# Patient Record
Sex: Female | Born: 1960 | Race: White | Hispanic: No | Marital: Married | Smoking: Former smoker
Health system: Southern US, Community
[De-identification: ages and names within clinical notes are randomized; demographics above are authoritative.]

## PROBLEM LIST (undated history)

## (undated) DIAGNOSIS — G43909 Migraine, unspecified, not intractable, without status migrainosus: Secondary | ICD-10-CM

## (undated) DIAGNOSIS — G5600 Carpal tunnel syndrome, unspecified upper limb: Secondary | ICD-10-CM

## (undated) DIAGNOSIS — H9072 Mixed conductive and sensorineural hearing loss, unilateral, left ear, with unrestricted hearing on the contralateral side: Secondary | ICD-10-CM

## (undated) DIAGNOSIS — G2581 Restless legs syndrome: Secondary | ICD-10-CM

## (undated) DIAGNOSIS — H8000 Otosclerosis involving oval window, nonobliterative, unspecified ear: Secondary | ICD-10-CM

## (undated) DIAGNOSIS — M1990 Unspecified osteoarthritis, unspecified site: Secondary | ICD-10-CM

## (undated) DIAGNOSIS — K449 Diaphragmatic hernia without obstruction or gangrene: Secondary | ICD-10-CM

## (undated) DIAGNOSIS — K219 Gastro-esophageal reflux disease without esophagitis: Secondary | ICD-10-CM

## (undated) HISTORY — DX: Carpal tunnel syndrome, unspecified upper limb: G56.00

## (undated) HISTORY — DX: Migraine, unspecified, not intractable, without status migrainosus: G43.909

## (undated) HISTORY — PX: UPPER GASTROINTESTINAL ENDOSCOPY: SHX188

## (undated) HISTORY — PX: COLONOSCOPY: SHX174

## (undated) HISTORY — DX: Unspecified osteoarthritis, unspecified site: M19.90

## (undated) HISTORY — DX: Otosclerosis involving oval window, nonobliterative, unspecified ear: H80.00

## (undated) HISTORY — DX: Diaphragmatic hernia without obstruction or gangrene: K44.9

## (undated) HISTORY — DX: Gastro-esophageal reflux disease without esophagitis: K21.9

## (undated) HISTORY — DX: Restless legs syndrome: G25.81

## (undated) HISTORY — DX: Mixed conductive and sensorineural hearing loss, unilateral, left ear, with unrestricted hearing on the contralateral side: H90.72

---

## 2000-10-14 ENCOUNTER — Other Ambulatory Visit: Admission: RE | Admit: 2000-10-14 | Discharge: 2000-10-14 | Payer: Self-pay | Admitting: Obstetrics and Gynecology

## 2000-11-04 ENCOUNTER — Ambulatory Visit (HOSPITAL_COMMUNITY): Admission: RE | Admit: 2000-11-04 | Discharge: 2000-11-04 | Payer: Self-pay | Admitting: Obstetrics and Gynecology

## 2000-11-04 ENCOUNTER — Encounter: Payer: Self-pay | Admitting: Obstetrics and Gynecology

## 2001-10-26 ENCOUNTER — Other Ambulatory Visit: Admission: RE | Admit: 2001-10-26 | Discharge: 2001-10-26 | Payer: Self-pay | Admitting: Obstetrics and Gynecology

## 2001-11-29 ENCOUNTER — Ambulatory Visit (HOSPITAL_COMMUNITY): Admission: RE | Admit: 2001-11-29 | Discharge: 2001-11-29 | Payer: Self-pay | Admitting: Obstetrics and Gynecology

## 2001-11-29 ENCOUNTER — Encounter: Payer: Self-pay | Admitting: Obstetrics and Gynecology

## 2002-09-14 ENCOUNTER — Ambulatory Visit (HOSPITAL_BASED_OUTPATIENT_CLINIC_OR_DEPARTMENT_OTHER): Admission: RE | Admit: 2002-09-14 | Discharge: 2002-09-14 | Payer: Self-pay | Admitting: Otolaryngology

## 2002-10-08 ENCOUNTER — Encounter: Admission: RE | Admit: 2002-10-08 | Discharge: 2002-10-08 | Payer: Self-pay | Admitting: Obstetrics and Gynecology

## 2002-10-08 ENCOUNTER — Encounter: Payer: Self-pay | Admitting: Obstetrics and Gynecology

## 2002-11-05 ENCOUNTER — Other Ambulatory Visit: Admission: RE | Admit: 2002-11-05 | Discharge: 2002-11-05 | Payer: Self-pay | Admitting: Obstetrics and Gynecology

## 2003-11-07 ENCOUNTER — Ambulatory Visit (HOSPITAL_COMMUNITY): Admission: RE | Admit: 2003-11-07 | Discharge: 2003-11-07 | Payer: Self-pay | Admitting: Obstetrics and Gynecology

## 2003-12-17 ENCOUNTER — Other Ambulatory Visit: Admission: RE | Admit: 2003-12-17 | Discharge: 2003-12-17 | Payer: Self-pay | Admitting: Obstetrics and Gynecology

## 2004-01-14 ENCOUNTER — Other Ambulatory Visit: Admission: RE | Admit: 2004-01-14 | Discharge: 2004-01-14 | Payer: Self-pay | Admitting: *Deleted

## 2004-12-02 ENCOUNTER — Ambulatory Visit (HOSPITAL_COMMUNITY): Admission: RE | Admit: 2004-12-02 | Discharge: 2004-12-02 | Payer: Self-pay | Admitting: Obstetrics and Gynecology

## 2005-02-05 ENCOUNTER — Ambulatory Visit: Payer: Self-pay | Admitting: Family Medicine

## 2005-03-09 ENCOUNTER — Ambulatory Visit: Payer: Self-pay | Admitting: Family Medicine

## 2005-09-22 ENCOUNTER — Emergency Department (HOSPITAL_COMMUNITY): Admission: EM | Admit: 2005-09-22 | Discharge: 2005-09-22 | Payer: Self-pay | Admitting: Emergency Medicine

## 2005-09-23 ENCOUNTER — Ambulatory Visit: Payer: Self-pay | Admitting: Family Medicine

## 2005-10-13 ENCOUNTER — Ambulatory Visit: Payer: Self-pay | Admitting: Family Medicine

## 2005-12-06 ENCOUNTER — Ambulatory Visit (HOSPITAL_COMMUNITY): Admission: RE | Admit: 2005-12-06 | Discharge: 2005-12-06 | Payer: Self-pay | Admitting: Obstetrics and Gynecology

## 2005-12-15 ENCOUNTER — Ambulatory Visit: Payer: Self-pay | Admitting: Internal Medicine

## 2006-03-23 ENCOUNTER — Encounter: Admission: RE | Admit: 2006-03-23 | Discharge: 2006-03-23 | Payer: Self-pay | Admitting: Obstetrics and Gynecology

## 2006-11-08 HISTORY — PX: STAPEDECTOMY: SHX2435

## 2007-02-08 ENCOUNTER — Ambulatory Visit: Payer: Self-pay | Admitting: Family Medicine

## 2007-02-08 ENCOUNTER — Encounter: Payer: Self-pay | Admitting: Family Medicine

## 2007-02-08 DIAGNOSIS — J019 Acute sinusitis, unspecified: Secondary | ICD-10-CM | POA: Insufficient documentation

## 2007-03-30 ENCOUNTER — Ambulatory Visit (HOSPITAL_COMMUNITY): Admission: RE | Admit: 2007-03-30 | Discharge: 2007-03-30 | Payer: Self-pay | Admitting: Obstetrics and Gynecology

## 2007-05-01 ENCOUNTER — Ambulatory Visit: Payer: Self-pay | Admitting: Family Medicine

## 2007-05-23 ENCOUNTER — Ambulatory Visit: Payer: Self-pay | Admitting: Internal Medicine

## 2007-05-23 DIAGNOSIS — N63 Unspecified lump in unspecified breast: Secondary | ICD-10-CM

## 2007-05-23 DIAGNOSIS — M542 Cervicalgia: Secondary | ICD-10-CM | POA: Insufficient documentation

## 2007-05-31 ENCOUNTER — Ambulatory Visit: Payer: Self-pay | Admitting: Internal Medicine

## 2007-05-31 DIAGNOSIS — K219 Gastro-esophageal reflux disease without esophagitis: Secondary | ICD-10-CM

## 2007-06-01 ENCOUNTER — Encounter: Admission: RE | Admit: 2007-06-01 | Discharge: 2007-06-01 | Payer: Self-pay | Admitting: Internal Medicine

## 2007-06-02 ENCOUNTER — Ambulatory Visit: Payer: Self-pay | Admitting: Internal Medicine

## 2007-06-05 ENCOUNTER — Ambulatory Visit: Payer: Self-pay | Admitting: Internal Medicine

## 2007-06-07 LAB — CONVERTED CEMR LAB
Amylase: 111 units/L (ref 27–131)
Basophils Absolute: 0 10*3/uL (ref 0.0–0.1)
Basophils Relative: 0.8 % (ref 0.0–1.0)
CK-MB: 1.1 ng/mL (ref 0.3–4.0)
Eosinophils Absolute: 0.1 10*3/uL (ref 0.0–0.6)
Eosinophils Relative: 2.6 % (ref 0.0–5.0)
HCT: 37.4 % (ref 36.0–46.0)
Hemoglobin: 13.1 g/dL (ref 12.0–15.0)
Lipase: 29 units/L (ref 11.0–59.0)
Lymphocytes Relative: 33.7 % (ref 12.0–46.0)
MCHC: 34.9 g/dL (ref 30.0–36.0)
MCV: 91.8 fL (ref 78.0–100.0)
Monocytes Absolute: 0.6 10*3/uL (ref 0.2–0.7)
Monocytes Relative: 11.3 % — ABNORMAL HIGH (ref 3.0–11.0)
Neutro Abs: 2.9 10*3/uL (ref 1.4–7.7)
Neutrophils Relative %: 51.6 % (ref 43.0–77.0)
Platelets: 262 10*3/uL (ref 150–400)
RBC: 4.07 M/uL (ref 3.87–5.11)
RDW: 12.2 % (ref 11.5–14.6)
Total CK: 58 units/L (ref 7–177)
WBC: 5.5 10*3/uL (ref 4.5–10.5)

## 2007-07-12 ENCOUNTER — Telehealth (INDEPENDENT_AMBULATORY_CARE_PROVIDER_SITE_OTHER): Payer: Self-pay | Admitting: *Deleted

## 2007-09-07 ENCOUNTER — Telehealth (INDEPENDENT_AMBULATORY_CARE_PROVIDER_SITE_OTHER): Payer: Self-pay | Admitting: *Deleted

## 2007-09-11 ENCOUNTER — Telehealth (INDEPENDENT_AMBULATORY_CARE_PROVIDER_SITE_OTHER): Payer: Self-pay | Admitting: *Deleted

## 2007-09-13 ENCOUNTER — Telehealth (INDEPENDENT_AMBULATORY_CARE_PROVIDER_SITE_OTHER): Payer: Self-pay | Admitting: *Deleted

## 2007-09-13 ENCOUNTER — Ambulatory Visit: Payer: Self-pay | Admitting: Internal Medicine

## 2007-09-14 ENCOUNTER — Ambulatory Visit: Payer: Self-pay | Admitting: Internal Medicine

## 2007-09-14 LAB — CONVERTED CEMR LAB
Basophils Absolute: 0 10*3/uL (ref 0.0–0.1)
Eosinophils Absolute: 0.2 10*3/uL (ref 0.0–0.6)
Eosinophils Relative: 4.9 % (ref 0.0–5.0)
GFR calc Af Amer: 100 mL/min
GFR calc non Af Amer: 82 mL/min
Glucose, Bld: 91 mg/dL (ref 70–99)
HCT: 38.9 % (ref 36.0–46.0)
Lymphocytes Relative: 36.5 % (ref 12.0–46.0)
MCHC: 34.5 g/dL (ref 30.0–36.0)
MCV: 92.4 fL (ref 78.0–100.0)
Monocytes Absolute: 0.4 10*3/uL (ref 0.2–0.7)
Neutro Abs: 2.4 10*3/uL (ref 1.4–7.7)
Neutrophils Relative %: 50.2 % (ref 43.0–77.0)
Potassium: 3.8 meq/L (ref 3.5–5.1)
RBC: 4.21 M/uL (ref 3.87–5.11)
Sodium: 140 meq/L (ref 135–145)

## 2007-09-25 ENCOUNTER — Telehealth (INDEPENDENT_AMBULATORY_CARE_PROVIDER_SITE_OTHER): Payer: Self-pay | Admitting: *Deleted

## 2007-10-03 ENCOUNTER — Telehealth: Payer: Self-pay | Admitting: Internal Medicine

## 2007-10-12 ENCOUNTER — Ambulatory Visit: Payer: Self-pay | Admitting: Gastroenterology

## 2007-10-13 ENCOUNTER — Encounter: Payer: Self-pay | Admitting: Internal Medicine

## 2007-10-13 ENCOUNTER — Ambulatory Visit: Payer: Self-pay | Admitting: Gastroenterology

## 2007-10-13 ENCOUNTER — Encounter: Payer: Self-pay | Admitting: Gastroenterology

## 2007-10-13 ENCOUNTER — Ambulatory Visit (HOSPITAL_COMMUNITY): Admission: RE | Admit: 2007-10-13 | Discharge: 2007-10-13 | Payer: Self-pay | Admitting: Gastroenterology

## 2007-10-16 ENCOUNTER — Encounter: Payer: Self-pay | Admitting: Gastroenterology

## 2007-10-26 ENCOUNTER — Ambulatory Visit: Payer: Self-pay | Admitting: Gastroenterology

## 2007-11-06 ENCOUNTER — Ambulatory Visit (HOSPITAL_COMMUNITY): Admission: RE | Admit: 2007-11-06 | Discharge: 2007-11-06 | Payer: Self-pay | Admitting: Gastroenterology

## 2007-11-20 ENCOUNTER — Encounter: Admission: RE | Admit: 2007-11-20 | Discharge: 2007-11-20 | Payer: Self-pay | Admitting: Obstetrics and Gynecology

## 2007-11-25 DIAGNOSIS — G43909 Migraine, unspecified, not intractable, without status migrainosus: Secondary | ICD-10-CM | POA: Insufficient documentation

## 2007-11-25 DIAGNOSIS — G2581 Restless legs syndrome: Secondary | ICD-10-CM

## 2007-12-01 ENCOUNTER — Ambulatory Visit: Payer: Self-pay | Admitting: Gastroenterology

## 2007-12-12 ENCOUNTER — Ambulatory Visit: Payer: Self-pay | Admitting: Cardiovascular Disease

## 2007-12-28 ENCOUNTER — Ambulatory Visit: Payer: Self-pay

## 2007-12-28 ENCOUNTER — Encounter: Payer: Self-pay | Admitting: Cardiovascular Disease

## 2008-01-15 ENCOUNTER — Ambulatory Visit: Payer: Self-pay | Admitting: Internal Medicine

## 2008-01-16 ENCOUNTER — Telehealth (INDEPENDENT_AMBULATORY_CARE_PROVIDER_SITE_OTHER): Payer: Self-pay | Admitting: *Deleted

## 2008-01-16 ENCOUNTER — Encounter: Payer: Self-pay | Admitting: Internal Medicine

## 2008-06-03 ENCOUNTER — Encounter: Admission: RE | Admit: 2008-06-03 | Discharge: 2008-06-03 | Payer: Self-pay | Admitting: Obstetrics and Gynecology

## 2008-09-13 ENCOUNTER — Ambulatory Visit: Payer: Self-pay | Admitting: Internal Medicine

## 2008-11-18 ENCOUNTER — Encounter: Admission: RE | Admit: 2008-11-18 | Discharge: 2008-11-18 | Payer: Self-pay | Admitting: Obstetrics and Gynecology

## 2009-01-13 ENCOUNTER — Ambulatory Visit: Payer: Self-pay | Admitting: Internal Medicine

## 2009-06-16 ENCOUNTER — Encounter: Admission: RE | Admit: 2009-06-16 | Discharge: 2009-06-16 | Payer: Self-pay | Admitting: Obstetrics and Gynecology

## 2009-09-23 ENCOUNTER — Telehealth (INDEPENDENT_AMBULATORY_CARE_PROVIDER_SITE_OTHER): Payer: Self-pay | Admitting: *Deleted

## 2009-09-25 ENCOUNTER — Ambulatory Visit: Payer: Self-pay | Admitting: Gastroenterology

## 2009-09-25 DIAGNOSIS — R1013 Epigastric pain: Secondary | ICD-10-CM | POA: Insufficient documentation

## 2009-09-25 DIAGNOSIS — K449 Diaphragmatic hernia without obstruction or gangrene: Secondary | ICD-10-CM | POA: Insufficient documentation

## 2009-09-26 ENCOUNTER — Encounter: Admission: RE | Admit: 2009-09-26 | Discharge: 2009-09-26 | Payer: Self-pay | Admitting: Obstetrics and Gynecology

## 2009-10-13 ENCOUNTER — Emergency Department (HOSPITAL_COMMUNITY): Admission: EM | Admit: 2009-10-13 | Discharge: 2009-10-13 | Payer: Self-pay | Admitting: Emergency Medicine

## 2009-11-04 ENCOUNTER — Telehealth (INDEPENDENT_AMBULATORY_CARE_PROVIDER_SITE_OTHER): Payer: Self-pay | Admitting: *Deleted

## 2009-11-04 ENCOUNTER — Ambulatory Visit: Payer: Self-pay | Admitting: Internal Medicine

## 2010-05-20 ENCOUNTER — Telehealth: Payer: Self-pay | Admitting: Internal Medicine

## 2010-05-21 ENCOUNTER — Ambulatory Visit: Payer: Self-pay | Admitting: Internal Medicine

## 2010-05-21 DIAGNOSIS — M25519 Pain in unspecified shoulder: Secondary | ICD-10-CM | POA: Insufficient documentation

## 2010-06-08 ENCOUNTER — Encounter: Payer: Self-pay | Admitting: Internal Medicine

## 2010-11-12 ENCOUNTER — Encounter
Admission: RE | Admit: 2010-11-12 | Discharge: 2010-11-12 | Payer: Self-pay | Source: Home / Self Care | Attending: Obstetrics and Gynecology | Admitting: Obstetrics and Gynecology

## 2010-11-26 ENCOUNTER — Ambulatory Visit
Admission: RE | Admit: 2010-11-26 | Discharge: 2010-11-26 | Payer: Self-pay | Source: Home / Self Care | Attending: Family Medicine | Admitting: Family Medicine

## 2010-11-28 ENCOUNTER — Encounter: Payer: Self-pay | Admitting: Obstetrics and Gynecology

## 2010-12-08 NOTE — Consult Note (Signed)
Summary: local injection to the left shoulder--orthopedic surgery  [facility]   Imported By: Lanelle Bal 06/18/2010 10:55:42  _____________________________________________________________________  External Attachment:    Type:   Image     Comment:   External Document

## 2010-12-08 NOTE — Progress Notes (Signed)
Summary: note for work  Phone Note Call from Patient Call back at [employer] [phone]   Caller: Patient Summary of Call: Pt would like a note for work stating that it hard for her to pick up items due to her health conditions. Pt is willing to come in the office if she needs to. Please advise. Army Fossa CMA  May 20, 2010 2:15 PM I don't recall treating her for any condition that prevents lifting. She will have to come and be evaluated. If she has a problem, I usually treat the patients before I gave them a  work excuse [place] E. Paz MD  May 20, 2010 3:04 PM   Follow-up for Phone Call        Pt has appt tomorrow. Army Fossa CMA  May 20, 2010 4:08 PM

## 2010-12-08 NOTE — Assessment & Plan Note (Signed)
Summary: discuss health conditions/work excuse/drb   Vital Signs:  Patient profile:   50 year old female Weight:      128.25 pounds Pulse rate:   71 / minute Pulse rhythm:   regular BP sitting:   124 / 78  (left arm) Cuff size:   regular  Vitals Entered By: Army Fossa CMA (May 21, 2010 2:40 PM) CC: Discuss pain in left side, work limitations note.  Comments - has had the pain for 1 year.    History of Present Illness: her chief complaint today is pain Pain is located in the left upper chest close to the left shoulder. The pain sometimes extends to the left neck, left rib cage Is worse at night Is worse when she exercises Has not taken any medication for this  Review of systems No neck pain per se Some epigastric pain, she saw GI on November 2010, note is reviewed, she was diagnosed with pain in the xiphoid process.  Chart is reviewed She was seen with somehow similar symptoms last year She had extensive workup including a d-dimer,chest x-ray,  EGD, a stress echo, abdominal ultrasound: Workup was unremarkable  Allergies (verified): No Known Drug Allergies  Past History:  Past Medical History: Reviewed history from 09/13/2008 and no changes required. Meniere's Disease RLS MIGRAINE HA G E R D , HH    Past Surgical History: Reviewed history from 09/25/2009 and no changes required. R Ear Surgery   Social History: Reviewed history from 09/25/2009 and no changes required. Occupation: [employer]  Patient is a former smoker.  Alcohol Use - no Daily Caffeine Use: Pepsi-one daily and tea Illicit Drug Use - no  Review of Systems      See HPI  Physical Exam  General:  alert and well-developed.   Neck:  supple and full ROM.  no tender in the spine to palpation Chest Wall:  not tender at the costochondral area Lungs:  normal respiratory effort, no intercostal retractions, no accessory muscle use, and normal breath sounds.   Heart:  normal rate, regular  rhythm, and no murmur.   Abdomen:  soft, no distention, no masses, no guarding, and no rigidity.  slightly tender at the epigastric area. Today she is nontender at the xyphoid process Msk:  right shoulder negative Left shoulder: No deformities + tenderness at the anterior aspect of the shoulder with a normal range of motion although pain is elicited with motion   Impression & Recommendations:  Problem # 1:  SHOULDER PAIN (ICD-719.41)  the patient has primarily a shoulder pain, is not clear to me why is she hurting in the left-sided rib cage Extensive workup last year negative Plan: Mobic orthopedic surgery referral The following medications were removed from the medication list:    Aleve 220 Mg Tabs (Naproxen sodium) .Marland Kitchen... 1 by mouth am, and 1 by mouth pm Her updated medication list for this problem includes:    Meloxicam 15 Mg Tabs (Meloxicam) ..... One a day with food as stated for pain  Orders: Orthopedic Referral (Ortho)  Complete Medication List: 1)  Mircette 0.15-0.02/0.01 Mg (21/5) Tabs (Desogestrel-ethinyl estradiol) .Marland Kitchen.. 1 by mouth qd 2)  Fish Oil Oil (Fish oil) .... One tablet by mouth once daily 3)  Vitamin D 1000 Unit Tabs (Cholecalciferol) .... One tablet by mouth once daily 4)  Meloxicam 15 Mg Tabs (Meloxicam) .... One a day with food as stated for pain  Patient Instructions: 1)  meloxicam one daily as needed for pain 2)  To protect your stomach , take Prilosec over-the-counter one daily before your breakfast Prescriptions: MELOXICAM 15 MG TABS (MELOXICAM) one a day with food as stated for pain  #30 x 1   Entered and Authorized by:   Elita Quick E. Paz MD   Signed by:   Nolon Rod. Paz MD on 05/21/2010   Method used:   Electronically to        [id number]* (retail)       [address]       [place], [place]  [address]       Ph: [phone]       Fax: [phone]   RxID:   [id number]

## 2010-12-10 NOTE — Assessment & Plan Note (Signed)
Summary: SINUS//PH   Vital Signs:  Patient profile:   50 year old female Weight:      131 pounds BMI:     23.29 Temp:     98.3 degrees F oral BP sitting:   112 / 64  (left arm)  Vitals Entered By: Doristine Devoid CMA (November 26, 2010 2:33 PM) CC: sinus HA and pressure along w/ cough    History of Present Illness: 50 yo woman here today for ? sinus infxn.  sxs started last week.  was taking sudafed and benadryl w/out relief.  developed facial pain and pressure.  + tooth pain.  no fever.  no ear pain.  + sick contacts.  having dry cough.  hx of sinus infxns.  Current Medications (verified): 1)  Mircette 0.15-0.02/0.01 Mg (21/5)  Tabs (Desogestrel-Ethinyl Estradiol) .Marland Kitchen.. 1 By Mouth Qd 2)  Fish Oil   Oil (Fish Oil) .... One Tablet By Mouth Once Daily 3)  Vitamin D 1000 Unit Tabs (Cholecalciferol) .... One Tablet By Mouth Once Daily 4)  Meloxicam 15 Mg Tabs (Meloxicam) .... One A Day With Food As Stated For Pain  Allergies (verified): No Known Drug Allergies  Review of Systems      See HPI  Physical Exam  General:  alert and well-developed.   Head:  NCAT, + TTP over sinuses Eyes:  no injxn or inflammation Ears:  External ear exam shows no significant lesions or deformities.  Otoscopic examination reveals clear canals, tympanic membranes are intact bilaterally without bulging, retraction, inflammation or discharge. Hearing is grossly normal bilaterally. Nose:  + congestion Mouth:  + PND Neck:  No deformities, masses, or tenderness noted. Lungs:  normal respiratory effort, no intercostal retractions, no accessory muscle use, and normal breath sounds.   Heart:  normal rate, regular rhythm, and no murmur.     Impression & Recommendations:  Problem # 1:  SINUSITIS- ACUTE-NOS (ICD-461.9) Assessment Unchanged pt's sxs and PE consistent w/ infxn.  start meds.  reviewed supportive care and red flags that should prompt return.  Pt expresses understanding and is in agreement w/ this  plan. Her updated medication list for this problem includes:    Amoxicillin 500 Mg Tabs (Amoxicillin) .Marland Kitchen... 2 tabs by mouth two times a day.  take w/ food.  Complete Medication List: 1)  Mircette 0.15-0.02/0.01 Mg (21/5) Tabs (Desogestrel-ethinyl estradiol) .Marland Kitchen.. 1 by mouth qd 2)  Fish Oil Oil (Fish oil) .... One tablet by mouth once daily 3)  Vitamin D 1000 Unit Tabs (Cholecalciferol) .... One tablet by mouth once daily 4)  Meloxicam 15 Mg Tabs (Meloxicam) .... One a day with food as stated for pain 5)  Amoxicillin 500 Mg Tabs (Amoxicillin) .... 2 tabs by mouth two times a day.  take w/ food.  Patient Instructions: 1)  This is a sinus infection- take the Amoxiciilin as directed. 2)  Increase your fluid intake 3)  Continue the Zyrtec and Netti pot 4)  Call with any questions or concerns 5)  Hang in there!!! Prescriptions: AMOXICILLIN 500 MG TABS (AMOXICILLIN) 2 tabs by mouth two times a day.  take w/ food.  #40 x 0   Entered and Authorized by:   Neena Rhymes MD   Signed by:   Neena Rhymes MD on 11/26/2010   Method used:   Electronically to        [facility] #[id number]* (retail)       [address]       [place] [employer] [place]  [place], [place]  [address]       Ph: [phone]       Fax: [phone]   RxID:   [id number]    Orders Added: 1)  Est. Patient Level III [84132]

## 2011-01-01 ENCOUNTER — Ambulatory Visit (INDEPENDENT_AMBULATORY_CARE_PROVIDER_SITE_OTHER): Payer: Managed Care, Other (non HMO) | Admitting: Internal Medicine

## 2011-01-01 ENCOUNTER — Encounter: Payer: Self-pay | Admitting: Internal Medicine

## 2011-01-01 ENCOUNTER — Other Ambulatory Visit: Payer: Self-pay | Admitting: Internal Medicine

## 2011-01-01 DIAGNOSIS — G5600 Carpal tunnel syndrome, unspecified upper limb: Secondary | ICD-10-CM | POA: Insufficient documentation

## 2011-01-01 LAB — BASIC METABOLIC PANEL
BUN: 9 mg/dL (ref 6–23)
Creatinine, Ser: 0.7 mg/dL (ref 0.4–1.2)
GFR: 94.56 mL/min (ref >60.00)
Glucose, Bld: 81 mg/dL (ref 70–99)

## 2011-01-01 LAB — TSH: TSH: 2.16 u[IU]/mL (ref 0.35–5.50)

## 2011-01-01 LAB — B12 AND FOLATE PANEL: Vitamin B-12: 360 pg/mL (ref 211–911)

## 2011-01-05 NOTE — Assessment & Plan Note (Signed)
Summary: tingleing in hands/cbs   Vital Signs:  Patient profile:   50 year old female Weight:      130.50 pounds Pulse rate:   72 / minute Pulse rhythm:   regular BP sitting:   124 / 84  (left arm) Cuff size:   regular  Vitals Entered By: Army Fossa CMA (January 01, 2011 1:52 PM) CC: x 2-3 weeks during the night hands felt they were going to sleep, feeling tingling in hands and bottom of foot Comments [facility] [address]    History of Present Illness:  2 weeks ago , she started to woke up with her hands "asleep"  Last week, she developed bilateral hand paresthesias  during the daytime, worse on the radial side of the hands. Symptoms are from the wrist down.   ROS Denies neck pain No rash No new medications except for Retin-A - a cream- prescribed by her  dermatologist   some unintentional weight gain  she started to do a treadmill 2 weeks ago , she does  have to grip the handles  Current Medications (verified): 1)  Mircette 0.15-0.02/0.01 Mg (21/5)  Tabs (Desogestrel-Ethinyl Estradiol) .Marland Kitchen.. 1 By Mouth Qd 2)  Fish Oil   Oil (Fish Oil) .... One Tablet By Mouth Once Daily 3)  Vitamin D 1000 Unit Tabs (Cholecalciferol) .... One Tablet By Mouth Once Daily 4)  Claritin 10 Mg Tabs (Loratadine) .... Qd  Allergies (verified): No Known Drug Allergies  Past History:  Past Medical History: Reviewed history from 09/13/2008 and no changes required. Meniere's Disease RLS MIGRAINE HA G E R D , HH    Past Surgical History: Reviewed history from 09/25/2009 and no changes required. R Ear Surgery   Social History: Reviewed history from 09/25/2009 and no changes required. Occupation: [employer]  Patient is a former smoker.  Alcohol Use - no Daily Caffeine Use: Pepsi-one daily and tea Illicit Drug Use - no  Physical Exam  General:  alert, well-developed, and well-nourished.   Msk:   inspection and  palpation of the wrists and hands normal Neurologic:  alert &  oriented X3, strength normal in all extremities, sensation intact to light touch, and DTRs symmetrical and normal.   Forced wrist extension replicated the  tingling  in the hands   Impression & Recommendations:  Problem # 1:  CARPAL TUNNEL SYNDROME (ICD-354.0)  suspect carpal tunnel syndrome Recommend to use a splinter for 3 weeks to 4 weeks  Labs If not better, she will let me know Orders: Venipuncture ([address]) TLB-B12 + Folate Pnl (60454_09811-B14/NWG) TLB-BMP (Basic Metabolic Panel-BMET) (80048-METABOL) TLB-TSH (Thyroid Stimulating Hormone) (84443-TSH) Specimen Handling (95621)  Complete Medication List: 1)  Mircette 0.15-0.02/0.01 Mg (21/5) Tabs (Desogestrel-ethinyl estradiol) .Marland Kitchen.. 1 by mouth qd 2)  Fish Oil Oil (Fish oil) .... One tablet by mouth once daily 3)  Vitamin D 1000 Unit Tabs (Cholecalciferol) .... One tablet by mouth once daily 4)  Claritin 10 Mg Tabs (Loratadine) .... Qd  Patient Instructions: 1)  call if no better in 3 or 4 weeks   Orders Added: 1)  Venipuncture [36415] 2)  TLB-B12 + Folate Pnl [82746_82607-B12/FOL] 3)  TLB-BMP (Basic Metabolic Panel-BMET) [80048-METABOL] 4)  TLB-TSH (Thyroid Stimulating Hormone) [84443-TSH] 5)  Specimen Handling [99000] 6)  Est. Patient Level III [30865]

## 2011-02-22 ENCOUNTER — Telehealth: Payer: Self-pay | Admitting: Gastroenterology

## 2011-02-22 NOTE — Telephone Encounter (Signed)
Pt with hx of GERD, HH, Abd. Pain, but she stated constipation is new. She had problems x 2 weeks and has taken Miralax 3 days w/ no results and last week she took Dulcolax w/o much luck.  Pt given an appt for tomorrow with Dr Jarold Motto.

## 2011-02-22 NOTE — Telephone Encounter (Signed)
Pt with hx of GERD, HH, ABD. Pain. Last office visit 09/25/09. LMOM for pt to call.

## 2011-02-23 ENCOUNTER — Ambulatory Visit (INDEPENDENT_AMBULATORY_CARE_PROVIDER_SITE_OTHER): Payer: Managed Care, Other (non HMO) | Admitting: Gastroenterology

## 2011-02-23 ENCOUNTER — Other Ambulatory Visit (INDEPENDENT_AMBULATORY_CARE_PROVIDER_SITE_OTHER): Payer: Managed Care, Other (non HMO)

## 2011-02-23 ENCOUNTER — Encounter: Payer: Self-pay | Admitting: Gastroenterology

## 2011-02-23 DIAGNOSIS — R1013 Epigastric pain: Secondary | ICD-10-CM

## 2011-02-23 DIAGNOSIS — K449 Diaphragmatic hernia without obstruction or gangrene: Secondary | ICD-10-CM

## 2011-02-23 DIAGNOSIS — K219 Gastro-esophageal reflux disease without esophagitis: Secondary | ICD-10-CM

## 2011-02-23 DIAGNOSIS — K5900 Constipation, unspecified: Secondary | ICD-10-CM

## 2011-02-23 LAB — CBC WITH DIFFERENTIAL/PLATELET
Basophils Absolute: 0 10*3/uL (ref 0.0–0.1)
HCT: 42.1 % (ref 36.0–46.0)
Lymphs Abs: 1.6 10*3/uL (ref 0.7–4.0)
MCHC: 34.6 g/dL (ref 30.0–36.0)
MCV: 93.1 fl (ref 78.0–100.0)
Monocytes Absolute: 0.7 10*3/uL (ref 0.1–1.0)
Platelets: 255 10*3/uL (ref 150.0–400.0)
RDW: 13.2 % (ref 11.5–14.6)

## 2011-02-23 LAB — BASIC METABOLIC PANEL
BUN: 10 mg/dL (ref 6–23)
Chloride: 104 mEq/L (ref 96–112)
Potassium: 4.5 mEq/L (ref 3.5–5.1)

## 2011-02-23 LAB — HEPATIC FUNCTION PANEL
AST: 16 U/L (ref 0–37)
Albumin: 3.7 g/dL (ref 3.5–5.2)
Alkaline Phosphatase: 48 U/L (ref 39–117)
Bilirubin, Direct: 0.1 mg/dL (ref 0.0–0.3)
Total Bilirubin: 0.5 mg/dL (ref 0.3–1.2)

## 2011-02-23 LAB — IBC PANEL
Saturation Ratios: 20.3 % (ref 20.0–50.0)
Transferrin: 345.3 mg/dL (ref 212.0–360.0)

## 2011-02-23 LAB — FERRITIN: Ferritin: 30 ng/mL (ref 10.0–291.0)

## 2011-02-23 LAB — TSH: TSH: 3.26 u[IU]/mL (ref 0.35–5.50)

## 2011-02-23 LAB — IGA: IgA: 122 mg/dL (ref 68–378)

## 2011-02-23 LAB — AMYLASE: Amylase: 96 U/L (ref 27–131)

## 2011-02-23 MED ORDER — TRAMADOL HCL 50 MG PO TABS: 50.0000 mg | ORAL_TABLET | Freq: Four times a day (QID) | ORAL | Status: AC | PRN

## 2011-02-23 MED ORDER — PEG-KCL-NACL-NASULF-NA ASC-C 100 G PO SOLR: 1.0000 | Freq: Once | ORAL | Status: AC

## 2011-02-23 MED ORDER — DEXLANSOPRAZOLE 60 MG PO CPDR: 60.0000 mg | DELAYED_RELEASE_CAPSULE | Freq: Every day | ORAL | Status: AC

## 2011-02-23 MED ORDER — ALIGN 4 MG PO CAPS: 1.0000 | ORAL_CAPSULE | Freq: Every day | ORAL | Status: DC

## 2011-02-23 MED ORDER — LUBIPROSTONE 8 MCG PO CAPS: 8.0000 ug | ORAL_CAPSULE | Freq: Two times a day (BID) | ORAL | Status: AC

## 2011-02-23 NOTE — Progress Notes (Signed)
This is a very nice 50 year old Caucasian female who presents with subxiphoid pain  rather constant in nature with last endoscopic exam several years ago. She has been on PPI therapy without improvement. She has rather severe constipation and may not have a bowel movement for 2 weeks. I cannot find a previous colonoscopy exam. Previous hepatobiliary workups have been negative and she denies any hepatobiliary complaints. Her appetite is good and her weight is stable. She denies abuse of alcohol, cigarettes, or NSAIDs. Family history is noncontributory. Recently she's been on lightly MiraLax, Benefiber, and stool softeners without improvement in her bowel pattern. There is no history of melena or hematochezia specific food intolerances.  Current Medications, Allergies, Past Medical History, Past Surgical History, Family History and Social History were reviewed in [employer] record.  Pertinent Review of Systems Negative   Physical Exam: Awake alert no acute distress without stigmata of chronic liver disease. Chest is clear there are no murmurs gallops or rubs noted. An appreciate hepatosplenomegaly, abdominal masses, distention, or tenderness. Bowel sounds are normal and peripheral extremities are unremarkable. Mental status is normal. Rectal exam was deferred.  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 Labs have been ordered and I will do colonoscopy and endoscopy with propofol sedation. Placed her on Dexilant 60 mg a day,Amitiza micrograms twice a day, qhs Miralax with liberal by mouth fluids, and when necessary tramadol 50 mg. She may need followup gallbladder imaging studies. Encounter Diagnoses  Name Primary?  . Constipation   . Esophageal reflux   . Hiatal hernia

## 2011-02-23 NOTE — Patient Instructions (Addendum)
Your procedure has been scheduled for 03/03/2011, please follow the seperate instructions.  Your prescription(s) have been sent to you pharmacy.  Please go to the basement today for your labs.  We have gave you samples of Align, Amitiza, Dexilant.

## 2011-02-24 LAB — GLIA (IGA/G) + TTG IGA
Gliadin IgA: 11.9 U/mL (ref <20)
Tissue Transglutaminase Ab, IgA: 5.5 U/mL (ref <20)

## 2011-03-02 ENCOUNTER — Encounter: Payer: Self-pay | Admitting: Gastroenterology

## 2011-03-02 ENCOUNTER — Telehealth: Payer: Self-pay | Admitting: Gastroenterology

## 2011-03-02 NOTE — Telephone Encounter (Signed)
On call note @ 1750. Pt cannot tolerate MoviPrep. Gagging after taking 16 oz. She wants another type of bowel prep. DC MoviPrep. Take Mag Citrate 2 bottles tonight and 2 tomorrow morning. Asked to contact the office in the morning if she does not have adequate result with this regimen.

## 2011-03-03 ENCOUNTER — Encounter: Payer: Self-pay | Admitting: Gastroenterology

## 2011-03-03 ENCOUNTER — Ambulatory Visit (AMBULATORY_SURGERY_CENTER): Payer: Managed Care, Other (non HMO) | Admitting: Gastroenterology

## 2011-03-03 DIAGNOSIS — K5900 Constipation, unspecified: Secondary | ICD-10-CM

## 2011-03-03 DIAGNOSIS — K3189 Other diseases of stomach and duodenum: Secondary | ICD-10-CM

## 2011-03-03 DIAGNOSIS — Z1211 Encounter for screening for malignant neoplasm of colon: Secondary | ICD-10-CM

## 2011-03-03 DIAGNOSIS — K5190 Ulcerative colitis, unspecified, without complications: Secondary | ICD-10-CM

## 2011-03-03 DIAGNOSIS — R1013 Epigastric pain: Secondary | ICD-10-CM

## 2011-03-03 DIAGNOSIS — R198 Other specified symptoms and signs involving the digestive system and abdomen: Secondary | ICD-10-CM

## 2011-03-03 DIAGNOSIS — K633 Ulcer of intestine: Secondary | ICD-10-CM

## 2011-03-03 DIAGNOSIS — K30 Functional dyspepsia: Secondary | ICD-10-CM

## 2011-03-03 DIAGNOSIS — K5904 Chronic idiopathic constipation: Secondary | ICD-10-CM

## 2011-03-03 DIAGNOSIS — K219 Gastro-esophageal reflux disease without esophagitis: Secondary | ICD-10-CM

## 2011-03-03 DIAGNOSIS — R109 Unspecified abdominal pain: Secondary | ICD-10-CM

## 2011-03-03 MED ORDER — SODIUM CHLORIDE 0.9 % IV SOLN: 500.0000 mL | INTRAVENOUS | Status: DC

## 2011-03-03 NOTE — Patient Instructions (Signed)
DISCHARGE INSTRUCTIONS REVIEWED WITH PT. & CAREPARTNER. DISCUSSED SAFETY PRECAUTIONS DUE TO ANESTHESIA GIVEN AND DIET RECOMMENDED FOR TODAY.

## 2011-03-03 NOTE — Progress Notes (Signed)
PROPOFOL PER J NULTY CRNA AS PER PROTOCOL.Marland KitchenPLEASE SEE SCANNED INTRA PROCEDURE REPORT ON THIS PATIENT

## 2011-03-04 ENCOUNTER — Telehealth: Payer: Self-pay | Admitting: *Deleted

## 2011-03-04 DIAGNOSIS — G5600 Carpal tunnel syndrome, unspecified upper limb: Secondary | ICD-10-CM

## 2011-03-04 DIAGNOSIS — K219 Gastro-esophageal reflux disease without esophagitis: Secondary | ICD-10-CM

## 2011-03-04 NOTE — Telephone Encounter (Signed)

## 2011-03-05 ENCOUNTER — Encounter: Payer: Self-pay | Admitting: Gastroenterology

## 2011-03-12 ENCOUNTER — Encounter: Payer: Self-pay | Admitting: Gastroenterology

## 2011-03-22 ENCOUNTER — Other Ambulatory Visit: Payer: Self-pay | Admitting: Obstetrics and Gynecology

## 2011-03-23 NOTE — Assessment & Plan Note (Signed)
[facility]                         GASTROENTEROLOGY OFFICE NOTE   NAME:Heather Collins, Heather Collins                         MRN:          [id number]  DATE:10/12/2007                            DOB:          12-27-1960    REFERRING PHYSICIAN:  Willow Ora, MD   GI CONSULTATION:   PROBLEM:  Patient is a 50 year old white female with severe burning  substernal chest pain the last two months, unresponsive to medical  therapy, including antacids, Carafate, twice-a-day PPI therapy.   ASSESSMENT:  Mrs. Foglio has symptoms that certainly seem consistent with  gastroesophageal reflux disease, but a lack of response to medical  therapy certainly is unusual and raises the possibility of other  diagnostic etiologies, including eosinophilic esophagitis, viral or  fungal infections, versus esophageal spasm or other esophageal motility  disorders.  We also need to consider an atypical presentation of  cholelithiasis.   RECOMMENDATIONS:  1. Outpatient endoscopy as soon as possible.  Should her endoscopy be      unremarkable, we will obtain esophageal biopsies to rule out      eosinophilic esophagitis.  2. Upper abdominal ultrasound exam as soon as possible.  3. Continue twice-a-day PPI therapy with p.r.n. NuLev antispasmodic      therapy.   HISTORY OF PRESENT ILLNESS:  Heather Collins has been in good health all of her  life until the last couple of months, when she has had rather severe  burning in her substernal area from her subxiphoid process to her upper  chest.  The pain does not radiate to her back and there is no associated  dysphagia or odynophagia.  She denies taking tetracycline tablets or  other medications be stuck in her esophagus, does not have chronic GERD.  She has not been on recent antibiotics or prednisone or other  immunosuppressants.  She has not had previous endoscopy or barium  studies.  She denies lower GI or hepatobiliary complaints per se.  She  denies any  specific food intolerances.  She has been treated  appropriately with acid suppression and Carafate by Dr. Drue Novel, without  response.   Recent laboratory data has shown normal chest x-ray, EKG times two.  Apparently she had a negative cardiac echo.  CBC and metabolic profile  have been normal.  Also normal was amylase, lipase, and CPK levels.   PAST MEDICAL HISTORY:  Otherwise noncontributory.   MEDICATIONS CURRENTLY:  1. Zegerid 40 mg twice a day.  2. Birth control pills.  3. Fish oil daily.  4. She uses p.r.n. Imitrex for migraine headaches.   FAMILY HISTORY:  Remarkable for atherosclerotic cardiovascular disease  in her mother, but no known gastrointestinal problems.   SOCIAL HISTORY:  She is married and lives with her husband and son.  She  has a Naval architect, works in [employer].  She does  not smoke or abuse ethanol.   REVIEW OF SYSTEMS:  Otherwise noncontributory.  She specifically denies  Raynaud's phenomenon.   PHYSICAL EXAM:  She is an attractive, healthy-appearing female,  appearing younger than her stated age.  She weighs 120  pounds and blood pressure is 94/60 and pulse was 70 and  regular.  Examination of the oropharyngeal area is unremarkable.  I could not appreciate lymphadenopathy in her neck or thyromegaly.  Her chest was clear and she was in a regular rhythm without murmurs,  gallops or rubs.  There was no hepatosplenomegaly, abdominal masses or  tenderness.  Bowel sounds were unremarkable.  Peripheral extremities were unremarkable.  Mental status is clear.     Vania Rea. Jarold Motto, MD, Caleen Essex, FAGA  Electronically Signed    DRP/MedQ  DD: 10/12/2007  DT: 10/12/2007  Job #: [id number]

## 2011-03-23 NOTE — Assessment & Plan Note (Signed)
[facility]                         GASTROENTEROLOGY OFFICE NOTE   NAME:Heather Collins, ROSIA SYME                         MRN:          [id number]  DATE:12/01/2007                            DOB:          10-02-1961    Frankee continues with burning in her chest and some atypical sharp pain in  her chest.  This really has not been improved with twice a day PPI  therapy or Librax.  The only thing that seems to help her pain is Ultram  50 mg.   As per my previous notes, she initially was doing much better.  She was  seen on October 26, 2007.  I scheduled her for monometry and 24 hour pH  probe test, but was unable to complete these exams because of severe  gagging and facial discomfort.   Her vital signs were all stable.  General physical exam was not repeated.   ASSESSMENT:  It is unlikely that Rossanna's pain is all from acid reflux, in  that she continues to have problems despite twice a day PPI treatment.  She has not had a complete cardiac workup and I will refer her to  cardiology for stress testing and perhaps angiography.   RECOMMENDATIONS:  1. Continue Nexium 40 mg twice a day 30 minutes before meals with      p.r.n. Carafate and p.r.n. tramadol 50 mg.  2. Cardiology referral as mentioned above.  3. GI followup in 6 to 8 weeks.     Heather Collins. Jarold Motto, MD, Caleen Essex, FAGA  Electronically Signed    DRP/MedQ  DD: 12/01/2007  DT: 12/01/2007  Job #: [id number]   cc:   [facility]  Willow Ora, MD

## 2011-03-23 NOTE — Assessment & Plan Note (Signed)
[facility]                         GASTROENTEROLOGY OFFICE NOTE   NAME:Heather Collins, Heather Collins                         MRN:          [id number]  DATE:10/26/2007                            DOB:          Feb 03, 1961    Darel Hong underwent endoscopy and upper abdominal ultrasound.  Her endoscopy  showed a 3 cm hiatal hernia but no erosive esophagitis.  Esophageal  biopsies were normal.  Ultrasound exam was unremarkable.   She was continued on Zegerid 40 mg twice a day along with Librax 3 times  a day and has had remarkable improvement.  She really denies any GI  complaint at this time except for occasional epigastric discomfort which  is precipitated by eating fried foods.  She has been under a lot of  personal stress with the death of an uncle and seems to have some  difficulty dealing with subject but denies severe depression symptoms.   She weighs 119 pounds and blood pressure 116/72 and pulse was 112 which  was somewhat tachycardic.  ABDOMINAL:  Unremarkable.  CHEST:  Clear and she was in a regular rhythm without murmurs, gallops  or rubs.   ASSESSMENT:  1. Probable acid reflux disease with atypical chest pain.  2. 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  3. History of migraine headaches with Imitrex use but she denies non-      steroidal anti-inflammatory drug abuse.   RECOMMENDATIONS:  1. Decrease Zegerid to 40 mg thirty minutes before breakfast with      strict anti-reflux maneuvers.  2. Continue Librax as needed.  3. A 24-hour pH probe testing and esophageal monometry have been      scheduled for November 06, 2007 and we will proceed with office      followup in 4-6 week's time.  4. Consider thyroid function testing if tachycardia persists.     Vania Rea. Jarold Motto, MD, Caleen Essex, FAGA  Electronically Signed    DRP/MedQ  DD: 10/26/2007  DT: 10/26/2007  Job #: [id number]   cc:   Willow Ora, MD

## 2011-03-23 NOTE — Assessment & Plan Note (Signed)
[facility]                            CARDIOLOGY OFFICE NOTE   NAME:Heather Collins, Heather Collins                         MRN:          [id number]  DATE:12/12/2007                            DOB:          [birth year]    Heather Collins is a delightful 50-year patient referred by Dr. Drue Novel for  recurring chest pain.  The patient has chest pain for over a year.  It  is atypical. I read through Dr. Leta Jungling notes.  The pain is fleeting, it  is sharp.  She describes it as cutting under her breast can wake her  up at night used to be worse, back about a year ago and there was some  radiation to her left arm.  Dr. Drue Novel felt that it was atypical.  He also  felt to may be related to reflux.  The patient had an EGD which was  unrevealing.  She has not had really have very good response to Zegerid  or other H2 blockers.   Apparently she also had esophageal manometry which was normal.   The patient is extremely concerned about her heart in regards to her  family history.   Her family history is as follows.  Father died at age 26 of emphysema,  lung cancer.  Mother is living at age 7 and had open heart surgery in  early age she has 29 year old brother who has high blood pressure.  On  the mother's side there is a grandmother who had congestive heart  failure and uncle with premature coronary disease and on her father's  side,  she has a grandfather who had premature heart attack.   Her other coronary risk factors are fairly benign.  She is not  hypercholesterolemic nonhypertensive, nondiabetic and does not smoke.   The the patient's review of systems otherwise negative in particular her  pain is not particularly exertional.  There is been no associated  pleuritic component.  She has no previous car accidents or  musculoskeletal trauma.  There is no generalized inflammatory problem  arthritis, or connective tissue disease.  Her past medical history is  remarkable for severe surgery  with Dr. Ezzard Standing and her previous GI workup  otherwise negative.     She gets nauseated with Darvocet but has no true allergies.   Her medications include Zegerid 40 b.i.d. birth control pills.  Karwa,  fish oil coenzyme Q, calcium, magnesium.   Her family history was as noted above.   The patient is happily married.  This is her husband's hometown.  She  has no 59 year old son who is at [employer].  The patient works  part-time at [employer]  She otherwise is fairly  sedentary and does not exercise on a regular basis.  She does not drink  or smoke.   EXAM:  Remarkable for somewhat anxious, middle-aged white female in no  distress.  Blood pressure is 112/72, pulse 81 regular, respiratory 14, afebrile.  HEENT:  Unremarkable.  Carotids are without bruit, no lymphadenopathy, thyromegaly, JVP  elevation.  LUNGS:  Clear diaphragmatic motion.  No  wheezing.  There is an S1, S2 normal heart sounds.  There is no prolapse or click.  PMI normal.  ABDOMEN:  Benign.  Bowel sounds positive AAA no tenderness, no past  medical.  Reflexes, pulse intact, no edema.  NEURO:  Nonfocal.  SKIN:  Warm and dry.  No muscular weakness.  No pain to palpation over  the sternum to indicate Tietze syndrome.  No pain with forced adduction  abduction.   EKG is normal.   IMPRESSION:  1. The patient's pain is atypical would like to rule out mitral valve      prolapse probe prolapse or Barlow syndrome.  Think the single test      that would be best for her would be a stress echo.  There is no      radiation involved and the imaging to rule out any structural heart      disease as well as rule out coronary disease given her strong      family history.  2. Question history of reflux.  Continue Zegerid and follow-up with      Dr. Jarold Motto.  3. Next anxiety, depression.  I think there is a bit of anxiety here,      particular related to her family history of heart disease.  Follow-       up with Dr. Drue Novel regarding possible SSRI therapy.   So long as the patient's stress echo was normal.  She will follow with  Dr. Drue Novel.  At some point she should have lab work to assess her  cholesterol level.     Noralyn Pick. Eden Emms, MD, [facility]  Electronically Signed    PCN/MedQ  DD: 12/12/2007  DT: 12/12/2007  Job #: [id number]   cc:   Willow Ora, MD

## 2011-03-26 NOTE — Op Note (Signed)
NAME:  Heather Collins, Heather Collins                            ACCOUNT NO.:  [id number]   MEDICAL RECORD NO.:  [id number]                   PATIENT TYPE:  AMB   LOCATION:  [facility]SC                                  FACILITY:  [facility]   PHYSICIAN:  Christopher E. Ezzard Standing, M.D.         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   OPERATION:  Right stapes mobilization.   SURGEON:  Kristine Garbe. Ezzard Standing, M.D.   ANESTHESIA:  General endotracheal.   COMPLICATIONS:  None.   BRIEF CLINICAL NOTE:  Heather Collins is a 50 year old female whose noticed the  gradual decline in her right ear hearing. She underwent audiologic testing  which showed a mild sensorineural hearing loss of approximately 20-30  decibels. In addition, she had a 20-25 decibel conductive hearing loss in  the right ear with SRT in the right ear of 50 decibels. Because of excessive  conductive hearing loss in the right ear, she is taken to the operating room  at this time for exploratory tympanotomy and possible stapedectomy.   DESCRIPTION OF PROCEDURE:  After adequate endotracheal anesthesia, the right  ear was prepped with Betadine solution, draped out with sterile towels. The  patient received 1 gm of Ancef IV preoperatively. The ear canal was then  irrigated with saline and injected with Xylocaine with epinephrine for  hemostasis. A posterior base tympanomeatal flap was elevated down to the  annulus. Cotton pledgets soaked in adrenaline were placed for hemostasis.  They were then removed and the annulus was elevated from the sulcus and  middle ear space was entered. On examination of the ossicles, the incus and  malleus appeared mobile; however,  the stapes appeared fixed anteriorly with  what appeared to be otosclerosis involving  mostly the anterior portion of  the stapes foot plate. The _____ joint was separated, pedal ligament was  transected and the stapes superstructure was fractured toward the  promontory. On attempted fracture of the stapes superstructure, the foot  plate became mobile. Because the foot plate was now mobile, it was elected  to leave the stapes intact and the stapes was repositioned underneath the  lenticular process of the incus.  Mobilization of the malleus, the incus and  the stapes were now mobile. This completed the procedure. The tympanomeatal  flap was brought back down, ear was packed with Gelfoam soaked in Colomycin  and Bacitracin ointment. A Band-Aid dressing was applied, the patient was  awoke from anesthesia and transferred to the recovery room postop doing  well.   DISPOSITION:  Heather Collins will be observed overnight in the recovery care center,  discharged home in the morning on Vicodin and Tylenol  pain and will have her  follow-up in my office in one week for recheck.                                              Kristine Garbe. Ezzard Standing, M.D.     CEN/MEDQ  D:  09/14/2002  T:  09/15/2002  Job:  [id number]   cc:   Angelena Sole, M.D. [facility]

## 2011-05-17 ENCOUNTER — Encounter: Payer: Self-pay | Admitting: Family Medicine

## 2011-05-17 ENCOUNTER — Ambulatory Visit (INDEPENDENT_AMBULATORY_CARE_PROVIDER_SITE_OTHER): Payer: Managed Care, Other (non HMO) | Admitting: Family Medicine

## 2011-05-17 VITALS — BP 98/62 | Temp 98.7°F | Wt 129.6 lb

## 2011-05-17 DIAGNOSIS — J019 Acute sinusitis, unspecified: Secondary | ICD-10-CM

## 2011-05-17 MED ORDER — AMOXICILLIN 875 MG PO TABS: 875.0000 mg | ORAL_TABLET | Freq: Two times a day (BID) | ORAL | Status: AC

## 2011-05-17 NOTE — Patient Instructions (Signed)
This is a sinus infection Take the Amoxicillin as directed- take w/ food to avoid upset stomach Drink plenty of fluids Mucinex to thin your congestion REST! Hang in there!

## 2011-05-17 NOTE — Progress Notes (Signed)
  Subjective:    Patient ID: Heather Collins, female    DOB: 12-16-1960, 50 y.o.   MRN: [id number]  HPI Sinus infxn- sxs started Friday w/ sinus congestion, facial pain/pressure.  Started coughing this AM- productive of green sputum.  + laryngitis.  Some sore throat today.  L ear pain.  + tooth pain.  No fever.  + sick contacts.   Review of Systems For ROS see HPI     Objective:   Physical Exam  Constitutional: She appears well-developed and well-nourished. No distress.  HENT:  Head: Normocephalic and atraumatic.  Right Ear: Tympanic membrane normal.  Left Ear: Tympanic membrane normal.  Nose: Mucosal edema and rhinorrhea present. Right sinus exhibits maxillary sinus tenderness and frontal sinus tenderness. Left sinus exhibits maxillary sinus tenderness and frontal sinus tenderness.  Mouth/Throat: Uvula is midline and mucous membranes are normal. Posterior oropharyngeal erythema present. No oropharyngeal exudate.  Eyes: Conjunctivae and EOM are normal. Pupils are equal, round, and reactive to light.  Neck: Normal range of motion. Neck supple.  Cardiovascular: Normal rate, regular rhythm and normal heart sounds.   Pulmonary/Chest: Effort normal and breath sounds normal. No respiratory distress. She has no wheezes.  Lymphadenopathy:    She has no cervical adenopathy.          Assessment & Plan:

## 2011-05-24 NOTE — Assessment & Plan Note (Signed)
Pt's hx and PE consistent w/ infxn.  Start abx.  Reviewed supportive care and red flags that should prompt return.  Pt expressed understanding and is in agreement w/ plan.  

## 2011-07-20 ENCOUNTER — Ambulatory Visit (INDEPENDENT_AMBULATORY_CARE_PROVIDER_SITE_OTHER): Payer: Managed Care, Other (non HMO) | Admitting: Internal Medicine

## 2011-07-20 ENCOUNTER — Encounter: Payer: Self-pay | Admitting: Internal Medicine

## 2011-07-20 DIAGNOSIS — J019 Acute sinusitis, unspecified: Secondary | ICD-10-CM

## 2011-07-20 MED ORDER — AMOXICILLIN 500 MG PO CAPS: 1000.0000 mg | ORAL_CAPSULE | Freq: Two times a day (BID) | ORAL | Status: AC

## 2011-07-20 NOTE — Assessment & Plan Note (Signed)
Symptoms consistent with acute sinusitis, see instructions 

## 2011-07-20 NOTE — Progress Notes (Signed)
  Subjective:    Patient ID: Heather Collins, female    DOB: 1961-10-13, 50 y.o.   MRN: [id number]  HPI Woke up this morning with bilateral facial pain, headache located in the middle of the forehead, some green nasal discharge. Also a month ago noted a "knot" in the  right arm pit, it was tender and ~ 1cm , currently has decreased a lot in size.  Past Medical History: Meniere's Disease RLS MIGRAINE HA G E R D , HH    Past Surgical History: Reviewed history from 09/25/2009 and no changes required. R Ear Surgery   Review of Systems Denies any current fevers or chills No cough or chest congestion She does have a lot of postnasal dripping. As far as the arm pit knot, she never saw any discharge, she does do self breast exam and is normal. Recent mammogram negative according to the patient.    Objective:   Physical Exam  Constitutional: She appears well-developed and well-nourished.  HENT:  Head: Normocephalic and atraumatic.  Right Ear: External ear normal.  Left Ear: External ear normal.  Mouth/Throat: No oropharyngeal exudate.       Nose quite congested, face symmetric and slightly tender at the maxillary sinuses  Eyes: Conjunctivae are normal. Right eye exhibits no discharge. Left eye exhibits no discharge.  Cardiovascular: Normal rate, regular rhythm and normal heart sounds.   No murmur heard. Pulmonary/Chest: Effort normal and breath sounds normal. No respiratory distress. She has no wheezes. She has no rales.  Skin:       Superficial, 1/2 cm induration at the R arm pit. No red, fluctuant , no d/c          Assessment & Plan:  Armpit lump: Seems to be a resolving folliculitis,  is too superficial to be a lymph node. Recommend observation, if not completely well within 4 weeks, she will let me know

## 2011-07-20 NOTE — Patient Instructions (Addendum)
Rest, fluids , tylenol For congestion, take Mucinex DM twice a day as needed  Take the antibiotic as prescribed...Marland KitchenMarland KitchenAmoxicillin Call if no better in few days Call anytime if the symptoms are severe, you have high fever, short of breath  Your BCP may not been as effective while on antibiotics

## 2011-10-18 ENCOUNTER — Other Ambulatory Visit: Payer: Self-pay | Admitting: Obstetrics and Gynecology

## 2011-10-18 DIAGNOSIS — Z1231 Encounter for screening mammogram for malignant neoplasm of breast: Secondary | ICD-10-CM

## 2011-11-15 ENCOUNTER — Ambulatory Visit
Admission: RE | Admit: 2011-11-15 | Discharge: 2011-11-15 | Disposition: A | Discharge status: Home / Self Care | Payer: Managed Care, Other (non HMO) | Source: Ambulatory Visit | Attending: Obstetrics and Gynecology | Admitting: Obstetrics and Gynecology

## 2011-11-15 DIAGNOSIS — Z1231 Encounter for screening mammogram for malignant neoplasm of breast: Secondary | ICD-10-CM

## 2011-12-14 ENCOUNTER — Encounter: Payer: Self-pay | Admitting: Internal Medicine

## 2011-12-14 ENCOUNTER — Ambulatory Visit (INDEPENDENT_AMBULATORY_CARE_PROVIDER_SITE_OTHER): Payer: Managed Care, Other (non HMO) | Admitting: Internal Medicine

## 2011-12-14 VITALS — BP 98/72 | HR 76 | Temp 98.0°F | Wt 131.0 lb

## 2011-12-14 DIAGNOSIS — J019 Acute sinusitis, unspecified: Secondary | ICD-10-CM

## 2011-12-14 MED ORDER — AMOXICILLIN 500 MG PO CAPS: 1000.0000 mg | ORAL_CAPSULE | Freq: Two times a day (BID) | ORAL | Status: AC

## 2011-12-14 NOTE — Assessment & Plan Note (Signed)
Acute versus bacterial sinusitis. See instructions

## 2011-12-14 NOTE — Patient Instructions (Signed)
Rest, fluids , tylenol For cough, take Mucinex DM twice a day as needed  For congestion use Sudafed (pseudoephedrine) behind the counter 30 mg every 4 to 6 hours as needed dymista 2 sprays on each side of the nose until sample gone  If no better in few das , start the antibiotic as prescribed ----> Amoxicillin Call if no better in few days Call anytime if the symptoms are severe  Antibiotics may interfere with birth control, need to take additional precautions

## 2011-12-14 NOTE — Progress Notes (Signed)
  Subjective:    Patient ID: Heather Collins, female    DOB: January 05, 1961, 51 y.o.   MRN: [id number]  HPI Acute visit  Sx started 3 days ago with sneezing, sinus congestion. Yesterday her symptoms got worse, she developed sinus pain, green nasal discharge.  Past Medical History:  Meniere's Disease  RLS  MIGRAINE HA  G E R D , HH  Past Surgical History:  R Ear Surgery   Review of Systems Denies fever chills No nausea, vomiting, diarrhea No chest congestion or cough.     Objective:   Physical Exam  Constitutional: She is oriented to person, place, and time. She appears well-developed and well-nourished. No distress.  HENT:  Head: Normocephalic and atraumatic.  Right Ear: External ear normal.  Left Ear: External ear normal.       Face symmetric, tender to palpation throughout the maxillary and frontal sinuses. Nose is slightly congested, throat without redness  Cardiovascular: Normal rate, regular rhythm and normal heart sounds.   No murmur heard. Pulmonary/Chest: Effort normal and breath sounds normal. No respiratory distress. She has no wheezes. She has no rales.  Neurological: She is alert and oriented to person, place, and time.  Skin: She is not diaphoretic.       Assessment & Plan:

## 2012-04-11 ENCOUNTER — Ambulatory Visit (INDEPENDENT_AMBULATORY_CARE_PROVIDER_SITE_OTHER): Payer: Managed Care, Other (non HMO) | Admitting: Internal Medicine

## 2012-04-11 ENCOUNTER — Encounter: Payer: Self-pay | Admitting: Internal Medicine

## 2012-04-11 VITALS — BP 108/72 | HR 68 | Temp 98.1°F | Wt 131.0 lb

## 2012-04-11 DIAGNOSIS — R5383 Other fatigue: Secondary | ICD-10-CM

## 2012-04-11 DIAGNOSIS — R5381 Other malaise: Secondary | ICD-10-CM

## 2012-04-11 DIAGNOSIS — E785 Hyperlipidemia, unspecified: Secondary | ICD-10-CM

## 2012-04-11 DIAGNOSIS — R1012 Left upper quadrant pain: Secondary | ICD-10-CM

## 2012-04-11 LAB — CBC WITH DIFFERENTIAL/PLATELET
Basophils Relative: 0.4 % (ref 0.0–3.0)
Eosinophils Absolute: 0.1 10*3/uL (ref 0.0–0.7)
Hemoglobin: 13.4 g/dL (ref 12.0–15.0)
Lymphocytes Relative: 34.7 % (ref 12.0–46.0)
MCHC: 33.8 g/dL (ref 30.0–36.0)
Neutro Abs: 3.6 10*3/uL (ref 1.4–7.7)
RBC: 4.25 Mil/uL (ref 3.87–5.11)

## 2012-04-11 NOTE — Assessment & Plan Note (Signed)
Cholesterol panel 03/23/2012 at gyn: Total cholesterol 188, LDL 99, HDL 48. Small LDL-P 708 which is a little high. Recommend more exercise and niacin thousand milligrams 2 decrease the particle size. Reassess in 6 months

## 2012-04-11 NOTE — Progress Notes (Signed)
  Subjective:    Patient ID: Heather Collins, female    DOB: June 19, 1961, 51 y.o.   MRN: [id number]  HPI Routine visit Went to her gynecologist for a physical, had a number of blood tests, likes to discuss them with me.  Past Medical History:  Meniere's Disease  RLS  MIGRAINE HA  G E R D , HH  Past Surgical History:  R Ear Surgery   Family History  Problem Relation Age of Onset  . Heart disease Mother     at age 76  . Colon cancer Neg Hx   . Lung cancer Father   . Heart attack Mother   . Coronary artery disease Paternal Uncle   . Hypertension      siblings     Review of Systems In general feels okay but has some concerns: Lack of energy for a couple of months, she denies any stress, anxiety, depression. Occasionally is not able to sleep well but that has been that way all her life. Denies snoring. Also on and off pain in the left upper quadrant, is worse when she sleeps on the left, denies any fever, chills, cough, weight loss, nausea, vomiting, diarrhea---> Patient is concerned, thinks it may be her spleen.    Objective:   Physical Exam General -- alert, well-developed, and well-nourished.   Lungs -- normal respiratory effort, no intercostal retractions, no accessory muscle use, and normal breath sounds.   Heart-- normal rate, regular rhythm, no murmur, and no gallop.   Abdomen--soft, non-tender, no distention, no masses, no HSM Extremities-- no pretibial edema bilaterally Neurologic-- alert & oriented X3 and strength normal in all extremities. Psych-- Cognition and judgment appear intact. Alert and cooperative with normal attention span and concentration.  not anxious appearing and not depressed appearing.       Assessment & Plan:   Recent labs drawn and gynecology discuss with the patient (see a/p) , patient was concerned about Epstein-Barr virus IgG titer which was positive. Being a IgG titer, is probably old infection. She does not have symptoms that suggest acute  infection. Of notice, all her vitamins were normal. ANA negative. I do not see a CBC, we are ordering one today  Today , I spent more than 25 min with the patient, >50% of the time counseling, and   reviewing labs ordered by other providers

## 2012-04-11 NOTE — Assessment & Plan Note (Signed)
Patient quite concerned about it, we agreed to do a ultrasound, she likes to be sure her spleen is okay

## 2012-04-11 NOTE — Patient Instructions (Addendum)
Please come back fasting in 6 months

## 2012-04-11 NOTE — Assessment & Plan Note (Addendum)
All recent labs reviewed, nothing to account for her fatigue. ROS neg. Will check a CBC. Recommend observation for now

## 2012-04-12 ENCOUNTER — Telehealth: Payer: Self-pay | Admitting: Internal Medicine

## 2012-04-12 MED ORDER — ALPRAZOLAM 0.5 MG PO TABS: 0.2500 mg | ORAL_TABLET | Freq: Every evening | ORAL | Status: AC | PRN

## 2012-04-12 NOTE — Telephone Encounter (Signed)
Discussed with pt

## 2012-04-12 NOTE — Telephone Encounter (Signed)
Patient called and stated she took 1 of the niacin yesterday and was itching extremely bad & this morning she has nausea, still itching & has a headache. She would like to know if she needs to take a lower dose of the Niacin or if there is something else that may work better with less side effects.  Patient also requested a low dose of Xanax for her sleeping issues, which she states she has discussed with Dr.Paz. Can send to [facility] on [address] if approved   Please call patient after 1pm today at [phone], as she is going to work and can not have her cell on during work hours

## 2012-04-12 NOTE — Telephone Encounter (Signed)
Refill done.  

## 2012-04-12 NOTE — Telephone Encounter (Signed)
Please advise 

## 2012-04-12 NOTE — Telephone Encounter (Signed)
1. No more Niaspan, she may be allergic to it (c/o itching). Take Benadryl as needed. 2. Work on more exercise and will recheck her cholesterol when she comes back. May need medication then. 3. Xanax 0.5 mg, 1/2  to one tablet at bedtime as needed, #30, 2 refills. 4. CBC was normal

## 2012-04-14 ENCOUNTER — Ambulatory Visit
Admission: RE | Admit: 2012-04-14 | Discharge: 2012-04-14 | Disposition: A | Discharge status: Home / Self Care | Payer: Managed Care, Other (non HMO) | Source: Ambulatory Visit | Attending: Internal Medicine | Admitting: Internal Medicine

## 2012-04-14 ENCOUNTER — Ambulatory Visit: Payer: Managed Care, Other (non HMO) | Admitting: Internal Medicine

## 2012-04-14 DIAGNOSIS — R1012 Left upper quadrant pain: Secondary | ICD-10-CM

## 2012-04-21 ENCOUNTER — Telehealth: Payer: Self-pay | Admitting: Internal Medicine

## 2012-04-21 NOTE — Telephone Encounter (Signed)
LMOVM for pt to return to call.   

## 2012-04-21 NOTE — Telephone Encounter (Signed)
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 Incomplete evaluation of the pancreatic  tail was possible. Otherwise unremarkable abdominal ultrasound.  Advise patient, Her spleen is normal. She does have cysts in the liver and left kidney. No need for further evaluation at this time.

## 2012-04-21 NOTE — Telephone Encounter (Signed)
Discussed with pt

## 2012-04-21 NOTE — Telephone Encounter (Signed)
Please advise 

## 2012-04-21 NOTE — Telephone Encounter (Signed)
Patient calling to find results from recent scan.

## 2012-07-19 ENCOUNTER — Other Ambulatory Visit: Payer: Self-pay

## 2012-11-09 ENCOUNTER — Other Ambulatory Visit: Payer: Self-pay | Admitting: Obstetrics and Gynecology

## 2012-11-09 DIAGNOSIS — Z1231 Encounter for screening mammogram for malignant neoplasm of breast: Secondary | ICD-10-CM

## 2012-11-16 ENCOUNTER — Ambulatory Visit: Payer: Managed Care, Other (non HMO)

## 2012-11-17 ENCOUNTER — Ambulatory Visit
Admission: RE | Admit: 2012-11-17 | Discharge: 2012-11-17 | Disposition: A | Discharge status: Home / Self Care | Payer: Managed Care, Other (non HMO) | Source: Ambulatory Visit | Attending: Obstetrics and Gynecology | Admitting: Obstetrics and Gynecology

## 2012-11-17 DIAGNOSIS — Z1231 Encounter for screening mammogram for malignant neoplasm of breast: Secondary | ICD-10-CM

## 2012-11-21 ENCOUNTER — Ambulatory Visit (INDEPENDENT_AMBULATORY_CARE_PROVIDER_SITE_OTHER): Payer: Managed Care, Other (non HMO) | Admitting: Internal Medicine

## 2012-11-21 ENCOUNTER — Encounter: Payer: Self-pay | Admitting: Internal Medicine

## 2012-11-21 VITALS — BP 110/72 | HR 74 | Temp 98.1°F | Wt 129.0 lb

## 2012-11-21 DIAGNOSIS — E785 Hyperlipidemia, unspecified: Secondary | ICD-10-CM

## 2012-11-21 DIAGNOSIS — R1012 Left upper quadrant pain: Secondary | ICD-10-CM

## 2012-11-21 DIAGNOSIS — J329 Chronic sinusitis, unspecified: Secondary | ICD-10-CM

## 2012-11-21 DIAGNOSIS — R5383 Other fatigue: Secondary | ICD-10-CM

## 2012-11-21 MED ORDER — AMOXICILLIN 500 MG PO CAPS: 1000.0000 mg | ORAL_CAPSULE | Freq: Two times a day (BID) | ORAL | Status: AC

## 2012-11-21 MED ORDER — FLUTICASONE PROPIONATE 50 MCG/ACT NA SUSP: 2.0000 | Freq: Every day | NASAL | Status: DC

## 2012-11-21 NOTE — Progress Notes (Signed)
  Subjective:    Patient ID: Heather Collins, female    DOB: November 07, 1961, 52 y.o.   MRN: [id number]  HPI Acute visit, symptoms started a week ago: Facial pressure and congestion mostly in the maxillary and frontal sinuses. Taking Sudafed helps temporarily. Also, concerned about her cholesterol. See assessment and plan.  Past Medical History  Diagnosis Date  . Esophageal reflux   . Hiatal hernia   . Carpal tunnel syndrome   . Restless legs syndrome (RLS)   . Migraine, unspecified, without mention of intractable migraine without mention of status migrainosus   . Lump or mass in breast   . Constipation   . Meniere disease    Past Surgical History  Procedure Date  . 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 Not much nasal discharge. Denies nausea, vomiting, diarrhea.     Objective:   Physical Exam  General -- alert, well-developed  HEENT -- TMs normal, throat w/o redness, face symmetric and  tender to palpation throughout Lungs -- normal respiratory effort, no intercostal retractions, no accessory muscle use, and normal breath sounds.   Heart-- normal rate, regular rhythm, no murmur, and no gallop.    Psych-- Cognition and judgment appear intact. Alert and cooperative with normal attention span and concentration.  not anxious appearing and not depressed appearing.      Assessment & Plan:   Sinusitis, viral versus bacterial, see instructions.

## 2012-11-21 NOTE — Assessment & Plan Note (Signed)
Symptoms resolve, no fatigue

## 2012-11-21 NOTE — Patient Instructions (Signed)
Rest, fluids , tylenol take Mucinex DM or plain mucinex  twice a day as needed  For congestion use flonase  nasal spray once a day until you feel better Saline nasal irrigation Take the antibiotic as prescribed  (Amoxicillin) if no better in 2-3 days  Call if no better in few days Call anytime if the symptoms are sever --- Please schedule fasting  labs: NMR dx hyperlipidemia

## 2012-11-21 NOTE — Assessment & Plan Note (Signed)
Unable to take niacin, will check a NMR, see instructions

## 2012-11-21 NOTE — Assessment & Plan Note (Signed)
Ultrasound 04/2012 Her spleen was normal.  She does have cysts in the liver and left kidney.  No need for further evaluation at this time.

## 2012-12-01 ENCOUNTER — Other Ambulatory Visit: Payer: Managed Care, Other (non HMO)

## 2012-12-05 ENCOUNTER — Other Ambulatory Visit: Payer: Managed Care, Other (non HMO)

## 2012-12-05 DIAGNOSIS — E785 Hyperlipidemia, unspecified: Secondary | ICD-10-CM

## 2012-12-06 LAB — NMR LIPOPROFILE WITH LIPIDS
HDL Size: 9.2 nm (ref >=9.2)
HDL-C: 63 mg/dL (ref >=40)
LDL Particle Number: 1568 nmol/L — ABNORMAL HIGH (ref <1000)
Triglycerides: 127 mg/dL (ref <150)
VLDL Size: 41.3 nm (ref >=46.6)

## 2012-12-19 ENCOUNTER — Telehealth: Payer: Self-pay | Admitting: Internal Medicine

## 2012-12-19 NOTE — Telephone Encounter (Signed)
Recommend exercise daily x 30 minutes.  She also needs a healthy diet, please arrange a nutritionist referral. dx hyperlipidemia.  Arrange office visit, fasting, in 4-5 months from now

## 2012-12-19 NOTE — Telephone Encounter (Signed)
lmovm for pt to return call.  

## 2012-12-19 NOTE — Telephone Encounter (Signed)
Spoke with pt & she states that prior to her labs she was not watching her diet or exercising but now she has started. Pt would like to know if she should do this first before starting the Tricor or if she should go ahead & start taking the med. Please advise.

## 2012-12-19 NOTE — Telephone Encounter (Signed)
Pt came in and dropped off new med list (put in red folder) and would also like a call so she could talk meds

## 2012-12-20 NOTE — Telephone Encounter (Signed)
Left detailed msg on pt's vmail.  

## 2012-12-23 ENCOUNTER — Other Ambulatory Visit: Payer: Self-pay

## 2013-01-03 ENCOUNTER — Telehealth: Payer: Self-pay | Admitting: Internal Medicine

## 2013-01-03 NOTE — Telephone Encounter (Signed)
Patient states her MyChart is telling her she is due for colonoscopy. Patient had coloncscopy on 03-03-11 and would like this added to her chart.

## 2013-01-03 NOTE — Telephone Encounter (Signed)
Updated pt's chart.  

## 2013-01-17 ENCOUNTER — Telehealth: Payer: Self-pay | Admitting: Internal Medicine

## 2013-01-17 NOTE — Telephone Encounter (Signed)
Appointment Request From: Christell Constant      With Provider: Willow Ora, MD [-Primary Care Physician-]      Preferred Date Range: Any date 01/17/2013 or later      Preferred Times: Any      Reason: To address the following health maintenance concerns.   Influenza Vaccine      Comments:   I had the flu shot on 09/11/2012 at [facility] please update my records

## 2013-01-17 NOTE — Telephone Encounter (Signed)
Immunization updated Pt notified via My chart.

## 2013-01-19 ENCOUNTER — Ambulatory Visit: Payer: Managed Care, Other (non HMO) | Admitting: Gastroenterology

## 2013-04-19 ENCOUNTER — Telehealth: Payer: Self-pay | Admitting: Internal Medicine

## 2013-04-19 NOTE — Telephone Encounter (Signed)
Chart has been updated.

## 2013-04-19 NOTE — Telephone Encounter (Signed)
Please update pts immunization record per MyChart request.  Appointment Request From: Heather Collins  With Provider: Willow Ora, MD [-Primary Care Physician-]  Preferred Date Range: Any date 04/18/2013 or later  Preferred Times: Any  Reason for visit: Annual Physical  Comments: Please update my records, I had a Tetanus shot done on 04/04/2013 from my GYN doctor (Dr. Cherly Hensen).

## 2013-04-30 ENCOUNTER — Ambulatory Visit: Payer: Managed Care, Other (non HMO) | Admitting: Internal Medicine

## 2013-04-30 ENCOUNTER — Encounter: Payer: Self-pay | Admitting: Internal Medicine

## 2013-04-30 ENCOUNTER — Ambulatory Visit (INDEPENDENT_AMBULATORY_CARE_PROVIDER_SITE_OTHER): Payer: Managed Care, Other (non HMO) | Admitting: Internal Medicine

## 2013-04-30 VITALS — BP 110/70 | HR 81 | Temp 98.1°F | Wt 133.6 lb

## 2013-04-30 DIAGNOSIS — J329 Chronic sinusitis, unspecified: Secondary | ICD-10-CM

## 2013-04-30 MED ORDER — AMOXICILLIN 500 MG PO CAPS: 1000.0000 mg | ORAL_CAPSULE | Freq: Two times a day (BID) | ORAL | Status: DC

## 2013-04-30 NOTE — Patient Instructions (Addendum)
Rest, fluids , tylenol For cough, take Mucinex DM twice a day as needed  For congestion use flonase 2 sprays on each side daily   Take the antibiotic as prescribed  (Amoxicillin) x 10 days Call if no better in few days Call anytime if the symptoms are severe

## 2013-04-30 NOTE — Progress Notes (Signed)
  Subjective:    Patient ID: Heather Collins, female    DOB: 05-26-1961, 52 y.o.   MRN: [id number]  HPI Acute visit Symptoms started last week: Facial pressure, blowing green nasal discharge, some cough she believes due to postnasal dripping. Husband had similar symptoms 2 weeks ago. She self started antibiotics, a leftover amoxicillin, few days ago and  felt somehow better.  Past Medical History  Diagnosis Date  . Esophageal reflux   . Hiatal hernia   . Carpal tunnel syndrome   . Restless legs syndrome (RLS)   . Migraine, unspecified, without mention of intractable migraine without mention of status migrainosus   . Lump or mass in breast   . Constipation   . Meniere disease    Past Surgical History  Procedure Laterality Date  . Ear mastoidectomy w/ cochlear implant w/ landmark     History  Substance Use Topics  . Smoking status: Former Games developer  . Smokeless tobacco: Never Used     Comment: quit in the 90s  . Alcohol Use: No    Review of Systems No fever or chills. No chest congestion No nausea, vomiting, diarrhea. No myalgias. She is taking Flonase 1 spray Daily on each side of the nose(wonders if it is okay to take two sprays)     Objective:   Physical Exam BP 110/70  Pulse 81  Temp(Src) 98.1 F (36.7 C) (Oral)  Wt 133 lb 9.6 oz (60.601 kg)  BMI 23.67 kg/m2  SpO2 97%  General -- alert, well-developed, NAD .   HEENT -- TMs normal, throat w/o redness, face symmetric and + tender to palpation, maxilary>frontal Lungs -- normal respiratory effort, no intercostal retractions, no accessory muscle use, and normal breath sounds.   Heart-- normal rate, regular rhythm, no murmur, and no gallop.   Extremities-- no pretibial edema bilaterally  Neurologic-- alert & oriented X3 and strength normal in all extremities. Psych-- Cognition and judgment appear intact. Alert and cooperative with normal attention span and concentration.  not anxious appearing and not depressed appearing.        Assessment & Plan:  Sinusitis, Moderate sinusitis, see instructions.

## 2013-05-01 ENCOUNTER — Encounter: Payer: Self-pay | Admitting: Internal Medicine

## 2013-05-07 ENCOUNTER — Ambulatory Visit: Payer: Managed Care, Other (non HMO) | Admitting: Internal Medicine

## 2013-05-18 ENCOUNTER — Encounter: Payer: Managed Care, Other (non HMO) | Admitting: Internal Medicine

## 2013-05-25 ENCOUNTER — Ambulatory Visit (INDEPENDENT_AMBULATORY_CARE_PROVIDER_SITE_OTHER): Payer: Managed Care, Other (non HMO) | Admitting: Internal Medicine

## 2013-05-25 ENCOUNTER — Ambulatory Visit: Payer: Managed Care, Other (non HMO) | Admitting: Internal Medicine

## 2013-05-25 ENCOUNTER — Encounter: Payer: Self-pay | Admitting: Internal Medicine

## 2013-05-25 ENCOUNTER — Other Ambulatory Visit: Payer: Self-pay | Admitting: Internal Medicine

## 2013-05-25 VITALS — BP 92/60 | HR 91 | Temp 97.8°F | Wt 130.6 lb

## 2013-05-25 DIAGNOSIS — E785 Hyperlipidemia, unspecified: Secondary | ICD-10-CM

## 2013-05-25 DIAGNOSIS — R42 Dizziness and giddiness: Secondary | ICD-10-CM | POA: Insufficient documentation

## 2013-05-25 LAB — BASIC METABOLIC PANEL
BUN: 15 mg/dL (ref 6–23)
Creatinine, Ser: 0.9 mg/dL (ref 0.4–1.2)
GFR: 70.98 mL/min (ref >60.00)

## 2013-05-25 LAB — CBC WITH DIFFERENTIAL/PLATELET
Basophils Absolute: 0 10*3/uL (ref 0.0–0.1)
Eosinophils Absolute: 0.2 10*3/uL (ref 0.0–0.7)
Lymphocytes Relative: 37 % (ref 12.0–46.0)
MCHC: 33.8 g/dL (ref 30.0–36.0)
Neutrophils Relative %: 52.1 % (ref 43.0–77.0)
Platelets: 274 10*3/uL (ref 150.0–400.0)
RDW: 12.9 % (ref 11.5–14.6)

## 2013-05-25 LAB — ALT: ALT: 13 U/L (ref 0–35)

## 2013-05-25 LAB — AST: AST: 17 U/L (ref 0–37)

## 2013-05-25 MED ORDER — AZELASTINE HCL 0.1 % NA SOLN: 2.0000 | Freq: Every day | NASAL | Status: DC

## 2013-05-25 NOTE — Patient Instructions (Addendum)
Drink plenty of fluids,. If the lightheadedness gets more persistent or severe let me know  In addition to Flonase, use Astelin 2 sprays in each side of the nose every night. Call if they sinus pressure is not improving over time. May need to see the ENT MD or have a CT Next visit, 6-8 months to see about your cholesterol

## 2013-05-25 NOTE — Assessment & Plan Note (Signed)
Felt lightheaded x2 when walking, no sx w/ every walk, no chest pain, palpitations or headaches. BP today slightly low, she is asymptomatic at the time of the BP check. On chart review her BP check has been low at times. Will check a CBC, recommend observation, drink plenty of fluids and call if symptoms increase or severe

## 2013-05-25 NOTE — Progress Notes (Signed)
  Subjective:    Patient ID: Heather Collins, female    DOB: 10/01/1961, 52 y.o.   MRN: [id number]  HPI Routine visit Followup on dyslipidemia, she has been exercising more, taking walks frequently. Her diet continued to be healthy. Was recently seen with sinusitis, status post antibiotics, feels better but is still has some sinus pressure. Also noted to feel lightheaded x 2  when walking, has not happened every time she walks.  Past Medical History  Diagnosis Date  . Esophageal reflux   . Hiatal hernia   . Carpal tunnel syndrome   . Restless legs syndrome (RLS)   . Migraine, unspecified, without mention of intractable migraine without mention of status migrainosus   . Lump or mass in breast   . Constipation   . Otosclerosis    Past Surgical History  Procedure Laterality Date  . Ear surgery (stadectomy) Right 2008    Dr Jac Canavan    Review of Systems No fever or chills, no nasal discharge No headache per se. Denies chest pain, shortness of breath, palpitations or double vision.     Objective:   Physical Exam BP 92/60  Pulse 91  Temp(Src) 97.8 F (36.6 C) (Oral)  Wt 130 lb 9.6 oz (59.24 kg)  BMI 23.14 kg/m2  SpO2 97%  General -- alert, well-developed, NAD    Lungs -- normal respiratory effort, no intercostal retractions, no accessory muscle use, and normal breath sounds.   Heart-- normal rate, regular rhythm, no murmur, and no gallop.   Extremities-- no pretibial edema bilaterally  Neurologic-- alert & oriented X3 , Speech, gait and motor are intact. EOMI, pupils equal and reactive Psych-- Cognition and judgment appear intact. Alert and cooperative with normal attention span and concentration.  not anxious appearing and not depressed appearing.       Assessment & Plan:

## 2013-05-25 NOTE — Assessment & Plan Note (Signed)
History of dyslipidemia, her diet is the same (healthy). Since the last time we checked her cholesterol, she is exercising more. Plan: NMR

## 2013-05-29 LAB — NMR LIPOPROFILE WITH LIPIDS
HDL Size: 9.2 nm (ref >=9.2)
HDL-C: 65 mg/dL (ref >=40)
LDL (calc): 110 mg/dL — ABNORMAL HIGH (ref <100)
LDL Particle Number: 1791 nmol/L — ABNORMAL HIGH (ref <1000)
LDL Size: 20.8 nm (ref >20.5)
LP-IR Score: 49 — ABNORMAL HIGH (ref <=45)
Large HDL-P: 10.3 umol/L (ref >=4.8)

## 2013-07-07 ENCOUNTER — Encounter: Payer: Self-pay | Admitting: Family Medicine

## 2013-07-07 ENCOUNTER — Ambulatory Visit (INDEPENDENT_AMBULATORY_CARE_PROVIDER_SITE_OTHER): Payer: Managed Care, Other (non HMO) | Admitting: Family Medicine

## 2013-07-07 VITALS — BP 98/62 | HR 64 | Temp 98.1°F | Wt 133.4 lb

## 2013-07-07 DIAGNOSIS — L255 Unspecified contact dermatitis due to plants, except food: Secondary | ICD-10-CM

## 2013-07-07 DIAGNOSIS — L237 Allergic contact dermatitis due to plants, except food: Secondary | ICD-10-CM | POA: Insufficient documentation

## 2013-07-07 MED ORDER — HYDROXYZINE HCL 25 MG PO TABS: 25.0000 mg | ORAL_TABLET | Freq: Three times a day (TID) | ORAL | Status: DC | PRN

## 2013-07-07 MED ORDER — PREDNISONE 20 MG PO TABS: ORAL_TABLET | ORAL | Status: DC

## 2013-07-07 NOTE — Patient Instructions (Signed)
Poison Ivy Poison ivy is a inflammation of the skin (contact dermatitis) caused by touching the allergens on the leaves of the ivy plant following previous exposure to the plant. The rash usually appears 48 hours after exposure. The rash is usually bumps (papules) or blisters (vesicles) in a linear pattern. Depending on your own sensitivity, the rash may simply cause redness and itching, or it may also progress to blisters which may break open. These must be well cared for to prevent secondary bacterial (germ) infection, followed by scarring. Keep any open areas dry, clean, dressed, and covered with an antibacterial ointment if needed. The eyes may also get puffy. The puffiness is worst in the morning and gets better as the day progresses. This dermatitis usually heals without scarring, within 2 to 3 weeks without treatment. HOME CARE INSTRUCTIONS  Thoroughly wash with soap and water as soon as you have been exposed to poison ivy. You have about one half hour to remove the plant resin before it will cause the rash. This washing will destroy the oil or antigen on the skin that is causing, or will cause, the rash. Be sure to wash under your fingernails as any plant resin there will continue to spread the rash. Do not rub skin vigorously when washing affected area. Poison ivy cannot spread if no oil from the plant remains on your body. A rash that has progressed to weeping sores will not spread the rash unless you have not washed thoroughly. It is also important to wash any clothes you have been wearing as these may carry active allergens. The rash will return if you wear the unwashed clothing, even several days later. Avoidance of the plant in the future is the best measure. Poison ivy plant can be recognized by the number of leaves. Generally, poison ivy has three leaves with flowering branches on a single stem. Diphenhydramine may be purchased over the counter and used as needed for itching. Do not drive with  this medication if it makes you drowsy.Ask your caregiver about medication for children. SEEK MEDICAL CARE IF:  Open sores develop.  Redness spreads beyond area of rash.  You notice purulent (pus-like) discharge.  You have increased pain.  Other signs of infection develop (such as fever). Document Released: 10/22/2000 Document Revised: 01/17/2012 Document Reviewed: 09/10/2009 ExitCare Patient Information 2014 [employer].  

## 2013-07-07 NOTE — Progress Notes (Signed)
  Subjective:    Patient ID: Heather Collins, female    DOB: 1961/10/05, 52 y.o.   MRN: [id number]  HPI CC: ? Poison ivy/oak  Working in neighbor's yard 7d ago pulling weeds.  Did not see any poison ivy. Itchy rash noticed 2d ago on thighs and abdomen, some spots on arms.  Very itchy. Self treated with alcohol and witch hazel, hydrocortisone cream OTC and benadryl.    Has had poison ivy in past, treated with systemic steroids, tolerated steroids well.  Denies other new lotions or detergents, soaps, shampoos.  No fevers/chills, oral lesions.  Past Medical History  Diagnosis Date  . Esophageal reflux   . Hiatal hernia   . Carpal tunnel syndrome   . Restless legs syndrome (RLS)   . Migraine, unspecified, without mention of intractable migraine without mention of status migrainosus   . Lump or mass in breast   . Constipation   . Otosclerosis      Review of Systems Pre HPI    Objective:   Physical Exam  Nursing note and vitals reviewed. Constitutional: She appears well-developed and well-nourished. No distress.  Skin: Rash noted. There is erythema.  Pruritic papular rash with small vesicles on R>L inner thigh, left abd into groin, smaller areas at bilateral axillary region and isolated vesicles on bilateral wrists, some excoriation of abdominal rash present       Assessment & Plan:

## 2013-07-07 NOTE — Assessment & Plan Note (Signed)
Given diffuse nature of rash, treat with oral prednisone taper. Use oatmeal bath and calamine lotion. hydroxyzine for itch. Handout provided. Pt agrees with plan.

## 2013-09-13 ENCOUNTER — Other Ambulatory Visit: Payer: Self-pay

## 2013-11-06 ENCOUNTER — Other Ambulatory Visit: Payer: Self-pay

## 2013-11-06 DIAGNOSIS — Z1231 Encounter for screening mammogram for malignant neoplasm of breast: Secondary | ICD-10-CM

## 2013-11-30 ENCOUNTER — Ambulatory Visit
Admission: RE | Admit: 2013-11-30 | Discharge: 2013-11-30 | Disposition: A | Discharge status: Home / Self Care | Payer: Managed Care, Other (non HMO) | Source: Ambulatory Visit

## 2013-11-30 DIAGNOSIS — Z1231 Encounter for screening mammogram for malignant neoplasm of breast: Secondary | ICD-10-CM

## 2014-11-02 ENCOUNTER — Telehealth: Payer: Self-pay | Admitting: Nurse Practitioner

## 2014-11-02 DIAGNOSIS — H6690 Otitis media, unspecified, unspecified ear: Secondary | ICD-10-CM

## 2014-11-02 MED ORDER — AMOXICILLIN 875 MG PO TABS: 875.0000 mg | ORAL_TABLET | Freq: Two times a day (BID) | ORAL | Status: DC

## 2014-11-02 NOTE — Progress Notes (Signed)
We are sorry that you are not feeling well.  Here is how we plan to help!  Based on what you have shared with me it looks like you have upper respiratory tract inflammation that has resulted in a signification cough.  Inflammation and infection in the upper respiratory tract is commonly called bronchitis and has four common causes:  Allergies, Viral Infections, Acid Reflux and Bacterial Infections.  Allergies, viruses and acid reflux are treated by controlling symptoms or eliminating the cause. An example might be a cough caused by taking certain blood pressure medications. You stop the cough by changing the medication. Another example might be a cough caused by acid reflux. Controlling the reflux helps control the cough.  Based on your presentation I believe you most likely have ear infection- An era infection is an infection behind your ear drum. I have rx amoxicillin 875mg  1 po BID X 10 days   In addition you may use Motrin or Tylenol OTC for fever and pain    HOME CARE . Only take medications as instructed by your medical team. . Complete the entire course of an antibiotic. . Drink plenty of fluids and get plenty of rest. . Avoid close contacts especially the very [place] and the elderly . Cover your mouth if you cough or cough into your sleeve. . Always remember to wash your hands . A steam or ultrasonic humidifier can help congestion.    GET HELP RIGHT AWAY IF: . You develop worsening fever. . You become short of breath . You cough up blood. . Your symptoms persist after you have completed your treatment plan MAKE SURE YOU   Understand these instructions.  Will watch your condition.  Will get help right away if you are not doing well or get worse.  Your e-visit answers were reviewed by a board certified advanced clinical practitioner to complete your personal care plan.  Depending on the condition, your plan could have included both over the counter or prescription  medications.  If there is a problem please reply  once you have received a response from your provider.  Your safety is important to [place].  If you have drug allergies check your prescription carefully.    You can use MyChart to ask questions about today's visit, request a non-urgent call back, or ask for a work or school excuse.  You will get an e-mail in the next two days asking about your experience.  I hope that your e-visit has been valuable and will speed your recovery. Thank you for using e-visits.

## 2014-11-06 ENCOUNTER — Other Ambulatory Visit: Payer: Self-pay

## 2014-11-06 DIAGNOSIS — Z1231 Encounter for screening mammogram for malignant neoplasm of breast: Secondary | ICD-10-CM

## 2014-11-08 HISTORY — PX: STAPEDECTOMY: SHX2435

## 2014-12-04 ENCOUNTER — Ambulatory Visit: Payer: Managed Care, Other (non HMO)

## 2014-12-04 ENCOUNTER — Ambulatory Visit
Admission: RE | Admit: 2014-12-04 | Discharge: 2014-12-04 | Disposition: A | Discharge status: Home / Self Care | Payer: 59 | Source: Ambulatory Visit

## 2014-12-04 DIAGNOSIS — Z1231 Encounter for screening mammogram for malignant neoplasm of breast: Secondary | ICD-10-CM

## 2015-03-31 ENCOUNTER — Other Ambulatory Visit: Payer: Self-pay | Admitting: Otolaryngology

## 2015-06-05 ENCOUNTER — Other Ambulatory Visit: Payer: Self-pay

## 2015-11-06 ENCOUNTER — Other Ambulatory Visit: Payer: Self-pay

## 2015-11-06 DIAGNOSIS — Z1231 Encounter for screening mammogram for malignant neoplasm of breast: Secondary | ICD-10-CM

## 2015-12-10 ENCOUNTER — Ambulatory Visit
Admission: RE | Admit: 2015-12-10 | Discharge: 2015-12-10 | Disposition: A | Discharge status: Home / Self Care | Payer: Managed Care, Other (non HMO) | Source: Ambulatory Visit

## 2015-12-10 DIAGNOSIS — Z1231 Encounter for screening mammogram for malignant neoplasm of breast: Secondary | ICD-10-CM

## 2016-07-09 LAB — HM PAP SMEAR: HM Pap smear: NORMAL

## 2016-08-18 LAB — LIPID PANEL
Cholesterol: 217 mg/dL — AB (ref 0–200)
HDL: 63 mg/dL (ref 35–70)
LDL CALC: 136 mg/dL
Triglycerides: 91 mg/dL (ref 40–160)

## 2016-08-25 ENCOUNTER — Encounter: Payer: Self-pay | Admitting: Internal Medicine

## 2016-11-04 ENCOUNTER — Other Ambulatory Visit: Payer: Self-pay | Admitting: Obstetrics and Gynecology

## 2016-11-04 DIAGNOSIS — Z1231 Encounter for screening mammogram for malignant neoplasm of breast: Secondary | ICD-10-CM

## 2016-12-17 ENCOUNTER — Ambulatory Visit: Payer: Managed Care, Other (non HMO)

## 2016-12-23 ENCOUNTER — Ambulatory Visit: Payer: Managed Care, Other (non HMO)

## 2016-12-28 ENCOUNTER — Ambulatory Visit
Admission: RE | Admit: 2016-12-28 | Discharge: 2016-12-28 | Disposition: A | Discharge status: Home / Self Care | Payer: BLUE CROSS/BLUE SHIELD | Source: Ambulatory Visit | Attending: Obstetrics and Gynecology | Admitting: Obstetrics and Gynecology

## 2016-12-28 DIAGNOSIS — Z1231 Encounter for screening mammogram for malignant neoplasm of breast: Secondary | ICD-10-CM

## 2017-01-11 ENCOUNTER — Telehealth: Payer: Self-pay | Admitting: Internal Medicine

## 2017-01-11 NOTE — Telephone Encounter (Signed)
-----   Message from Colon Branch, MD sent at 01/11/2017 11:56 AM EST ----- Regarding: RE: To est as new pt Contact: [phone] That is ok, please schedule an appointment at her convenience   ----- Message ----- From: Rosalin Hawking Sent: 01/11/2017   8:00 AM To: Colon Branch, MD Subject: To est as new pt                               Pt use to be seen by Dr Larose Kells but has not come in for over 3 years and pt would like to est with Dr Larose Kells again. Please advise.

## 2017-01-11 NOTE — Telephone Encounter (Signed)
Pt was informed and scheduled appt in May 18, 2017.

## 2017-05-16 ENCOUNTER — Encounter: Payer: Self-pay | Admitting: Behavioral Health

## 2017-05-16 ENCOUNTER — Telehealth: Payer: Self-pay | Admitting: Behavioral Health

## 2017-05-16 NOTE — Telephone Encounter (Signed)
Pre-Visit Call completed with patient and chart updated.   Pre-Visit Info documented in Specialty Comments under [employer].    

## 2017-05-18 ENCOUNTER — Ambulatory Visit (INDEPENDENT_AMBULATORY_CARE_PROVIDER_SITE_OTHER): Payer: BLUE CROSS/BLUE SHIELD | Admitting: Internal Medicine

## 2017-05-18 ENCOUNTER — Encounter: Payer: Self-pay | Admitting: Internal Medicine

## 2017-05-18 VITALS — BP 118/78 | HR 71 | Temp 98.1°F | Resp 14 | Ht 62.75 in | Wt 128.4 lb

## 2017-05-18 DIAGNOSIS — Z09 Encounter for follow-up examination after completed treatment for conditions other than malignant neoplasm: Secondary | ICD-10-CM | POA: Insufficient documentation

## 2017-05-18 DIAGNOSIS — Z1159 Encounter for screening for other viral diseases: Secondary | ICD-10-CM

## 2017-05-18 DIAGNOSIS — Z0000 Encounter for general adult medical examination without abnormal findings: Secondary | ICD-10-CM

## 2017-05-18 DIAGNOSIS — Z114 Encounter for screening for human immunodeficiency virus [HIV]: Secondary | ICD-10-CM

## 2017-05-18 LAB — CBC WITH DIFFERENTIAL/PLATELET
BASOS ABS: 0 10*3/uL (ref 0.0–0.1)
Basophils Relative: 0.8 % (ref 0.0–3.0)
EOS PCT: 2.3 % (ref 0.0–5.0)
Eosinophils Absolute: 0.1 10*3/uL (ref 0.0–0.7)
HCT: 39.7 % (ref 36.0–46.0)
HEMOGLOBIN: 13.6 g/dL (ref 12.0–15.0)
Lymphocytes Relative: 39.5 % (ref 12.0–46.0)
Lymphs Abs: 1.6 10*3/uL (ref 0.7–4.0)
MCHC: 34.3 g/dL (ref 30.0–36.0)
MCV: 91.4 fl (ref 78.0–100.0)
Monocytes Absolute: 0.3 10*3/uL (ref 0.1–1.0)
Monocytes Relative: 7.7 % (ref 3.0–12.0)
Neutro Abs: 2 10*3/uL (ref 1.4–7.7)
Neutrophils Relative %: 49.7 % (ref 43.0–77.0)
Platelets: 221 10*3/uL (ref 150.0–400.0)
RBC: 4.34 Mil/uL (ref 3.87–5.11)
RDW: 12.9 % (ref 11.5–15.5)
WBC: 4.1 10*3/uL (ref 4.0–10.5)

## 2017-05-18 LAB — COMPREHENSIVE METABOLIC PANEL
ALT: 17 U/L (ref 0–35)
AST: 20 U/L (ref 0–37)
Albumin: 4.1 g/dL (ref 3.5–5.2)
Alkaline Phosphatase: 66 U/L (ref 39–117)
BUN: 18 mg/dL (ref 6–23)
CO2: 28 mEq/L (ref 19–32)
Calcium: 9.5 mg/dL (ref 8.4–10.5)
Chloride: 105 mEq/L (ref 96–112)
Creatinine, Ser: 0.81 mg/dL (ref 0.40–1.20)
GFR: 77.94 mL/min (ref >60.00)
GLUCOSE: 91 mg/dL (ref 70–99)
POTASSIUM: 4.3 meq/L (ref 3.5–5.1)
SODIUM: 140 meq/L (ref 135–145)
Total Bilirubin: 0.6 mg/dL (ref 0.2–1.2)
Total Protein: 6.8 g/dL (ref 6.0–8.3)

## 2017-05-18 LAB — LIPID PANEL
Cholesterol: 218 mg/dL — ABNORMAL HIGH (ref 0–200)
HDL: 67 mg/dL (ref >39.00)
LDL Cholesterol: 136 mg/dL — ABNORMAL HIGH (ref 0–99)
NONHDL: 151.08
Total CHOL/HDL Ratio: 3
Triglycerides: 77 mg/dL (ref 0.0–149.0)
VLDL: 15.4 mg/dL (ref 0.0–40.0)

## 2017-05-18 LAB — HEPATITIS C ANTIBODY: HCV AB: NEGATIVE

## 2017-05-18 LAB — TSH: TSH: 2.51 u[IU]/mL (ref 0.35–4.50)

## 2017-05-18 LAB — HIV ANTIBODY (ROUTINE TESTING W REFLEX): HIV: NONREACTIVE

## 2017-05-18 NOTE — Progress Notes (Signed)
Pre visit review using our clinic review tool, if applicable. No additional management support is needed unless otherwise documented below in the visit note. 

## 2017-05-18 NOTE — Patient Instructions (Signed)
GO TO THE LAB : Get the blood work     GO TO THE FRONT DESK Schedule your next appointment for a  physical exam in one year  

## 2017-05-18 NOTE — Assessment & Plan Note (Signed)
GERD, RLS, migraine headaches: Not an issue at this point. Headaches decrease after she started to become menopausal. GERD: On PPIs as needed HOH: on Florical due to calcium deposits in the middle ear RTC one year

## 2017-05-18 NOTE — Assessment & Plan Note (Signed)
-   TD 2015 -Female care: Dr. Garwin Brothers. Last pap 07-2016. MMG 12-2016 -[facility]: Colonoscopy 02-2011, no polyps, biopsy negative. Next should be 10 years. -Diet and exercise: Doing great. -Labs: CMP, FLP, CBC, TSH, HIV, hep C, vitamin D

## 2017-05-18 NOTE — Progress Notes (Signed)
Subjective:    Patient ID: Heather Collins, female    DOB: Aug 15, 1961, 56 y.o.   MRN: [id number]  DOS:  05/18/2017 Type of visit - description : New patient, last visit 2014, CPX Interval history: No major concerns   Review of Systems  A 14 point review of systems is negative   Past Medical History:  Diagnosis Date  . Carpal tunnel syndrome   . Esophageal reflux   . Hiatal hernia   . Migraine, unspecified, without mention of intractable migraine without mention of status migrainosus   . 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 Otosclerosis involving oval window, nonobliterative    Bilateral  . Restless legs syndrome (RLS)     Past Surgical History:  Procedure Laterality Date  . STAPEDECTOMY Left 2016   Dr. Cresenciano Lick, with a 4.0 mm titanium Quentin Cornwall prosthesis with a large well and narrow shaft over a vein graft  . STAPEDECTOMY Right 2008    Social History   Social History  . Marital status: Married    Spouse name: N/A  . Number of children: 1  . Years of education: N/A   Occupational History  . Medical Records  [employer]   Social History Main Topics  . Smoking status: Former Research scientist (life sciences)  . Smokeless tobacco: Never Used     Comment: quit in the 90s  . Alcohol use No  . Drug use: No  . Sexual activity: Not on file   Other Topics Concern  . Not on file   Social History Narrative  . No narrative on file     Family History  Problem Relation Age of Onset  . Heart disease Mother 68       at age 55  . Lung cancer Father   . Coronary artery disease Paternal Uncle   . Hypertension Unknown        siblings  . Colon cancer Neg Hx     Allergies as of 05/18/2017   No Known Allergies     Medication List       Accurate as of 05/18/17  9:07 AM. Always use your most recent med list.          cetirizine 10 MG tablet Commonly known as:  ZYRTEC Take 10 mg by mouth daily.     cholecalciferol 1000 units tablet Commonly known as:  VITAMIN D Take 1,000 Units by mouth daily.   sodium fluoride-calcium carbonate 8.3-364 MG Caps capsule Commonly known as:  FLORICAL Take 1 capsule by mouth 2 (two) times daily.          Objective:   Physical Exam BP 118/78 (BP Location: Right Arm, Patient Position: Sitting, Cuff Size: Small)   Pulse 71   Temp 98.1 F (36.7 C) (Oral)   Resp 14   Ht 5' 2.75" (1.594 m)   Wt 128 lb 6 oz (58.2 kg)   LMP 04/19/2017 (Approximate)   SpO2 97%   BMI 22.92 kg/m   General:   Well developed, well nourished . NAD.  Neck: No  thyromegaly  HEENT:  Normocephalic . Face symmetric, atraumatic Lungs:  CTA B Normal respiratory effort, no intercostal retractions, no accessory muscle use. Heart: RRR,  no murmur.  No pretibial edema bilaterally  Abdomen:  Not distended, soft, non-tender. No rebound or rigidity.   Skin: Exposed areas without rash. Not pale. Not jaundice Neurologic:  alert & oriented X3.  Speech normal,  gait appropriate for age and unassisted Strength symmetric and appropriate for age.  Psych: Cognition and judgment appear intact.  Cooperative with normal attention span and concentration.  Behavior appropriate. No anxious or depressed appearing.    Assessment & Plan:   Assessment GERD Dyslipidemia Pre-menopausal RLS Migraine headaches HOH , on Florical to prevent calcium deposits in the middle ear EGD 02-2015 wnl,, BX negative  Plan GERD, RLS, migraine headaches: Not an issue at this point. Headaches decrease after she started to become menopausal. GERD: On PPIs as needed HOH: on Florical due to calcium deposits in the middle ear RTC one year

## 2017-05-21 LAB — VITAMIN D 1,25 DIHYDROXY
VITAMIN D3 1, 25 (OH): 35 pg/mL
Vitamin D 1, 25 (OH)2 Total: 35 pg/mL (ref 18–72)
Vitamin D2 1, 25 (OH)2: <8 pg/mL

## 2017-08-05 ENCOUNTER — Ambulatory Visit (INDEPENDENT_AMBULATORY_CARE_PROVIDER_SITE_OTHER): Payer: BLUE CROSS/BLUE SHIELD | Admitting: Internal Medicine

## 2017-08-05 ENCOUNTER — Encounter: Payer: Self-pay | Admitting: Internal Medicine

## 2017-08-05 VITALS — BP 118/78 | HR 58 | Temp 98.1°F | Resp 14 | Ht 62.75 in | Wt 128.4 lb

## 2017-08-05 DIAGNOSIS — K146 Glossodynia: Secondary | ICD-10-CM | POA: Diagnosis not present

## 2017-08-05 MED ORDER — NYSTATIN 100000 UNIT/ML MT SUSP: 5.0000 mL | Freq: Four times a day (QID) | OROMUCOSAL | 0 refills | Status: DC

## 2017-08-05 NOTE — Patient Instructions (Signed)
No braces for 3-4 weeks  Nystatin 1/2 teaspoon swish and spit 4 times a day x 5 days  Call if no better or see your dentist

## 2017-08-05 NOTE — Progress Notes (Signed)
Subjective:    Patient ID: Heather Collins, female    DOB: 1961-01-08, 56 y.o.   MRN: [id number]  DOS:  08/05/2017 Type of visit - description : acute Interval history:  The patient had dental work in March, the crown remained slightly loose and she started using a retainer on April 2018 Weeks ago noted a burning sensation on the tongue and some white bumps, wonders if she has thrush    Review of Systems Denies GERD symptoms No antibiotics use in long time   Past Medical History:  Diagnosis Date  . Carpal tunnel syndrome   . Esophageal reflux   . Hiatal hernia   . Migraine, unspecified, without mention of intractable migraine without mention of status migrainosus   . 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 Otosclerosis involving oval window, nonobliterative    Bilateral  . Restless legs syndrome (RLS)     Past Surgical History:  Procedure Laterality Date  . STAPEDECTOMY Left 2016   Dr. Cresenciano Lick, with a 4.0 mm titanium Quentin Cornwall prosthesis with a large well and narrow shaft over a vein graft  . STAPEDECTOMY Right 2008    Social History   Social History  . Marital status: Married    Spouse name: N/A  . Number of children: 1  . Years of education: N/A   Occupational History  . Medical Records  [employer]   Social History Main Topics  . Smoking status: Former Research scientist (life sciences)  . Smokeless tobacco: Never Used     Comment: quit in the 90s  . Alcohol use No  . Drug use: No  . Sexual activity: Not on file   Other Topics Concern  . Not on file   Social History Narrative   Son,  1997, going to college      Allergies as of 08/05/2017   No Known Allergies     Medication List       Accurate as of 08/05/17  2:33 PM. Always use your most recent med list.          cetirizine 10 MG tablet Commonly known as:  ZYRTEC Take 10 mg by mouth daily.   cholecalciferol 1000 units tablet Commonly  known as:  VITAMIN D Take 1,000 Units by mouth daily.   FISH OIL PO Take 1 tablet by mouth daily.   sodium fluoride-calcium carbonate 8.3-364 MG Caps capsule Commonly known as:  FLORICAL Take 1 capsule by mouth 2 (two) times daily.          Objective:   Physical Exam BP 118/78 (BP Location: Left Arm, Patient Position: Sitting, Cuff Size: Small)   Pulse (!) 58   Temp 98.1 F (36.7 C) (Oral)   Resp 14   Ht 5' 2.75" (1.594 m)   Wt 128 lb 6 oz (58.2 kg)   SpO2 97%   BMI 22.92 kg/m  General:   Well developed, well nourished . NAD.  HEENT:  Normocephalic . Face symmetric, atraumatic Throat: Symmetric, tongue essentially normal to inspection, gum and oral membranes normal as well.  Skin: Not pale. Not jaundice Neurologic:  alert & oriented X3.  Speech normal, gait appropriate for age and unassisted Psych--  Cognition and judgment appear intact.  Cooperative with normal attention span and concentration.  Behavior appropriate. No anxious or depressed appearing.      Assessment & Plan:  Assessment GERD Dyslipidemia Pre-menopausal RLS Migraine headaches HOH , on Florical  to prevent calcium deposits in the middle ear EGD 02-2015 wnl,, BX negative  Plan Tongue discomfort-burning: ?Thrush. No evidence of thrush on today's exam but the patient reports that she had some white spots before. Pt wonder if her symptoms have to do with a retainer she is using (I agree, related?) To be sure, will do a trial with nystatin for 5 days in case she had thrush at some point recently, avoid the retainer for 3 or 4 weeks then go back on it; If symptoms resurface, recommend to see her dentist or let me know.

## 2017-08-05 NOTE — Progress Notes (Signed)
Pre visit review using our clinic review tool, if applicable. No additional management support is needed unless otherwise documented below in the visit note. 

## 2017-08-06 NOTE — Assessment & Plan Note (Signed)
Tongue discomfort-burning: ?Thrush. No evidence of thrush on today's exam but the patient reports that she had some white spots before. Pt wonder if her symptoms have to do with a retainer she is using (I agree, related?) To be sure, will do a trial with nystatin for 5 days in case she had thrush at some point recently, avoid the retainer for 3 or 4 weeks then go back on it; If symptoms resurface, recommend to see her dentist or let me know.

## 2017-11-18 ENCOUNTER — Other Ambulatory Visit: Payer: Self-pay

## 2017-11-21 ENCOUNTER — Ambulatory Visit (HOSPITAL_BASED_OUTPATIENT_CLINIC_OR_DEPARTMENT_OTHER)
Admission: RE | Admit: 2017-11-21 | Discharge: 2017-11-21 | Disposition: A | Discharge status: Home / Self Care | Payer: BLUE CROSS/BLUE SHIELD | Source: Ambulatory Visit | Attending: Internal Medicine | Admitting: Internal Medicine

## 2017-11-21 ENCOUNTER — Ambulatory Visit: Payer: BLUE CROSS/BLUE SHIELD | Admitting: Internal Medicine

## 2017-11-21 ENCOUNTER — Encounter: Payer: Self-pay | Admitting: Internal Medicine

## 2017-11-21 VITALS — BP 118/68 | HR 68 | Temp 98.1°F | Resp 14 | Ht 63.0 in | Wt 127.5 lb

## 2017-11-21 DIAGNOSIS — M19041 Primary osteoarthritis, right hand: Secondary | ICD-10-CM | POA: Insufficient documentation

## 2017-11-21 DIAGNOSIS — K219 Gastro-esophageal reflux disease without esophagitis: Secondary | ICD-10-CM | POA: Diagnosis not present

## 2017-11-21 DIAGNOSIS — M7989 Other specified soft tissue disorders: Secondary | ICD-10-CM

## 2017-11-21 NOTE — Progress Notes (Signed)
Pre visit review using our clinic review tool, if applicable. No additional management support is needed unless otherwise documented below in the visit note. 

## 2017-11-21 NOTE — Progress Notes (Signed)
Subjective:    Patient ID: Heather Collins, female    DOB: [birth year], 57 y.o.   MRN: [id number]  DOS:  11/21/2017 Type of visit - description : Routine follow-up Interval history: See last visit, tongue discomfort went away GERD: Asymptomatic. DEXA?  Never had one, just become menopausal June 2018. Complain of third right finger deformity, PIP is enlarged.   Review of Systems Denies any injury, third finger has never been red or swollen that she can recall.  Past Medical History:  Diagnosis Date  . Carpal tunnel syndrome   . Esophageal reflux   . Hiatal hernia   . Migraine, unspecified, without mention of intractable migraine without mention of status migrainosus   . 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 Otosclerosis involving oval window, nonobliterative    Bilateral  . Restless legs syndrome (RLS)     Past Surgical History:  Procedure Laterality Date  . STAPEDECTOMY Left 2016   Dr. Cresenciano Lick, with a 4.0 mm titanium Quentin Cornwall prosthesis with a large well and narrow shaft over a vein graft  . STAPEDECTOMY Right 2008    Social History   Socioeconomic History  . Marital status: Married    Spouse name: Not on file  . Number of children: 1  . Years of education: Not on file  . Highest education level: Not on file  Social Needs  . Financial resource strain: Not on file  . Food insecurity - worry: Not on file  . Food insecurity - inability: Not on file  . Transportation needs - medical: Not on file  . Transportation needs - non-medical: Not on file  Occupational History  . Occupation: Government social research officer Records     Employer: [employer]  Tobacco Use  . Smoking status: Former Research scientist (life sciences)  . Smokeless tobacco: Never Used  . Tobacco comment: quit in the 90s  Substance and Sexual Activity  . Alcohol use: No  . Drug use: No  . Sexual activity: Not on file  Other Topics Concern  . Not on file  Social  History Narrative   Son,  1997, going to college      Allergies as of 11/21/2017   No Known Allergies     Medication List        Accurate as of 11/21/17 11:59 PM. Always use your most recent med list.          cetirizine 10 MG tablet Commonly known as:  ZYRTEC Take 10 mg by mouth daily.   cholecalciferol 1000 units tablet Commonly known as:  VITAMIN D Take 1,000 Units by mouth daily.   FISH OIL PO Take 1 tablet by mouth daily.   omeprazole 20 MG tablet Commonly known as:  PRILOSEC OTC Take 20 mg by mouth daily.   sodium fluoride-calcium carbonate 8.3-364 MG Caps capsule Commonly known as:  FLORICAL Take 1 capsule by mouth 2 (two) times daily.          Objective:   Physical Exam BP 118/68 (BP Location: Left Arm, Patient Position: Sitting, Cuff Size: Small)   Pulse 68   Temp 98.1 F (36.7 C) (Oral)   Resp 14   Ht 5\' 3"  (1.6 m)   Wt 127 lb 8 oz (57.8 kg)   SpO2 98%   BMI 22.59 kg/m  General:   Well developed, well nourished . NAD.  HEENT:  Normocephalic . Face symmetric, atraumatic Lungs:  CTA B Normal respiratory  effort, no intercostal retractions, no accessory muscle use. Heart: RRR,  no murmur.  No pretibial edema bilaterally  MSK: Wrists with symptoms consistent with mild DJD, no synovitis.   3th R finger PIP larger than the other joints, not red, swollen, slightly TTP. DIP: Unable to fully flex. Neurologic:  alert & oriented X3.  Speech normal, gait appropriate for age and unassisted Psych--  Cognition and judgment appear intact.  Cooperative with normal attention span and concentration.  Behavior appropriate. No anxious or depressed appearing.      Assessment & Plan:   Assessment GERD Dyslipidemia Pre-menopausal, LMP ~ 04/2017 RLS Migraine headaches HOH , on Florical to prevent calcium deposits in the middle ear EGD 02-2015 wnl,, BX negative  Plan GERD: Asx, continue PPIs Tongue discomfort: See last visit, resolved Swelling, third  right finger: See physical exam, no actual synovitis, PIP slightly enlarged.  Does not recall any injury.  Will get x-ray to rule out erosions.  Declined further eval at this point. RTC 6-8 months, CPX

## 2017-11-21 NOTE — Patient Instructions (Signed)
  GO TO THE FRONT DESK Schedule your next appointment for a  Physical exam in 6-8 months    STOP BY THE FIRST FLOOR:  get the XR

## 2017-11-22 NOTE — Assessment & Plan Note (Signed)
GERD: Asx, continue PPIs Tongue discomfort: See last visit, resolved Swelling, third right finger: See physical exam, no actual synovitis, PIP slightly enlarged.  Does not recall any injury.  Will get x-ray to rule out erosions.  Declined further eval at this point. RTC 6-8 months, CPX

## 2017-12-07 ENCOUNTER — Other Ambulatory Visit: Payer: Self-pay | Admitting: Obstetrics and Gynecology

## 2017-12-07 DIAGNOSIS — Z1231 Encounter for screening mammogram for malignant neoplasm of breast: Secondary | ICD-10-CM

## 2018-01-04 ENCOUNTER — Ambulatory Visit
Admission: RE | Admit: 2018-01-04 | Discharge: 2018-01-04 | Disposition: A | Discharge status: Home / Self Care | Payer: BLUE CROSS/BLUE SHIELD | Source: Ambulatory Visit | Attending: Obstetrics and Gynecology | Admitting: Obstetrics and Gynecology

## 2018-01-04 DIAGNOSIS — Z1231 Encounter for screening mammogram for malignant neoplasm of breast: Secondary | ICD-10-CM

## 2018-07-31 ENCOUNTER — Encounter: Payer: Self-pay | Admitting: Internal Medicine

## 2018-07-31 ENCOUNTER — Ambulatory Visit: Payer: PRIVATE HEALTH INSURANCE | Admitting: Internal Medicine

## 2018-07-31 VITALS — BP 122/74 | HR 66 | Temp 97.8°F | Resp 16 | Ht 63.0 in | Wt 125.1 lb

## 2018-07-31 DIAGNOSIS — Z0000 Encounter for general adult medical examination without abnormal findings: Secondary | ICD-10-CM | POA: Diagnosis not present

## 2018-07-31 LAB — CBC WITH DIFFERENTIAL/PLATELET
BASOS PCT: 0.6 % (ref 0.0–3.0)
Basophils Absolute: 0 10*3/uL (ref 0.0–0.1)
EOS PCT: 4.1 % (ref 0.0–5.0)
Eosinophils Absolute: 0.2 10*3/uL (ref 0.0–0.7)
HCT: 41.5 % (ref 36.0–46.0)
Hemoglobin: 14.4 g/dL (ref 12.0–15.0)
LYMPHS ABS: 1.5 10*3/uL (ref 0.7–4.0)
LYMPHS PCT: 35.1 % (ref 12.0–46.0)
MCHC: 34.6 g/dL (ref 30.0–36.0)
MCV: 90.9 fl (ref 78.0–100.0)
MONOS PCT: 8.3 % (ref 3.0–12.0)
Monocytes Absolute: 0.4 10*3/uL (ref 0.1–1.0)
NEUTROS ABS: 2.2 10*3/uL (ref 1.4–7.7)
Neutrophils Relative %: 51.9 % (ref 43.0–77.0)
Platelets: 226 10*3/uL (ref 150.0–400.0)
RBC: 4.57 Mil/uL (ref 3.87–5.11)
RDW: 12.9 % (ref 11.5–15.5)
WBC: 4.3 10*3/uL (ref 4.0–10.5)

## 2018-07-31 LAB — COMPREHENSIVE METABOLIC PANEL
ALBUMIN: 4.5 g/dL (ref 3.5–5.2)
ALK PHOS: 57 U/L (ref 39–117)
ALT: 21 U/L (ref 0–35)
AST: 19 U/L (ref 0–37)
BUN: 18 mg/dL (ref 6–23)
CO2: 30 meq/L (ref 19–32)
CREATININE: 0.76 mg/dL (ref 0.40–1.20)
Calcium: 9.4 mg/dL (ref 8.4–10.5)
Chloride: 104 mEq/L (ref 96–112)
GFR: 83.52 mL/min (ref >60.00)
Glucose, Bld: 83 mg/dL (ref 70–99)
Potassium: 4.3 mEq/L (ref 3.5–5.1)
Sodium: 140 mEq/L (ref 135–145)
Total Bilirubin: 0.7 mg/dL (ref 0.2–1.2)
Total Protein: 7 g/dL (ref 6.0–8.3)

## 2018-07-31 LAB — LIPID PANEL
CHOL/HDL RATIO: 3
Cholesterol: 199 mg/dL (ref 0–200)
HDL: 57.6 mg/dL (ref >39.00)
LDL Cholesterol: 123 mg/dL — ABNORMAL HIGH (ref 0–99)
NonHDL: 141.77
TRIGLYCERIDES: 92 mg/dL (ref 0.0–149.0)
VLDL: 18.4 mg/dL (ref 0.0–40.0)

## 2018-07-31 NOTE — Progress Notes (Signed)
Pre visit review using our clinic review tool, if applicable. No additional management support is needed unless otherwise documented below in the visit note. 

## 2018-07-31 NOTE — Assessment & Plan Note (Signed)
-   TD 2015 -Female care: Dr. Garwin Brothers: next appointment 09-2018. MMG 12-2017 -CCS: Colonoscopy 02-2011, no polyps, biopsy negative. Next should be 10 years. -Diet and exercise: Doing great. --Dexa: not indicated this year -Labs: CMP, FLP, CBC

## 2018-07-31 NOTE — Patient Instructions (Signed)
GO TO THE LAB : Get the blood work     GO TO THE FRONT DESK Schedule your next appointment for a  Physical exam in 1 year 

## 2018-07-31 NOTE — Progress Notes (Signed)
Subjective:    Patient ID: Heather Collins, female    DOB: May 14, 1961, 57 y.o.   MRN: [id number]  DOS:  07/31/2018 Type of visit - description : Physical exam Interval history: dong well, no major concerns.  Review of Systems In the last few days had some nasal congestion, mild sore throat.  Other than above, a 14 point review of systems is negative    Past Medical History:  Diagnosis Date  . Carpal tunnel syndrome   . Esophageal reflux   . Hiatal hernia   . Migraine, unspecified, without mention of intractable migraine without mention of status migrainosus   . 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 Otosclerosis involving oval window, nonobliterative    Bilateral  . Restless legs syndrome (RLS)     Past Surgical History:  Procedure Laterality Date  . STAPEDECTOMY Left 2016   Dr. Cresenciano Lick, with a 4.0 mm titanium Quentin Cornwall prosthesis with a large well and narrow shaft over a vein graft  . STAPEDECTOMY Right 2008    Social History   Socioeconomic History  . Marital status: Married    Spouse name: Not on file  . Number of children: 1  . Years of education: Not on file  . Highest education level: Not on file  Occupational History  . Occupation: Government social research officer Records     Employer: [employer]  Social Needs  . Financial resource strain: Not on file  . Food insecurity:    Worry: Not on file    Inability: Not on file  . Transportation needs:    Medical: Not on file    Non-medical: Not on file  Tobacco Use  . Smoking status: Former Research scientist (life sciences)  . Smokeless tobacco: Never Used  . Tobacco comment: quit in the 90s  Substance and Sexual Activity  . Alcohol use: No  . Drug use: No  . Sexual activity: Not on file  Lifestyle  . Physical activity:    Days per week: Not on file    Minutes per session: Not on file  . Stress: Not on file  Relationships  . Social connections:    Talks on phone: Not on  file    Gets together: Not on file    Attends religious service: Not on file    Active member of club or organization: Not on file    Attends meetings of clubs or organizations: Not on file    Relationship status: Not on file  . Intimate partner violence:    Fear of current or ex partner: Not on file    Emotionally abused: Not on file    Physically abused: Not on file    Forced sexual activity: Not on file  Other Topics Concern  . Not on file  Social History Narrative   Son,  1997, going to college     Family History  Problem Relation Age of Onset  . Heart disease Mother 48       at age 31  . Lung cancer Father   . Coronary artery disease Paternal Uncle   . Hypertension Unknown        siblings  . Colon cancer Neg Hx   . Breast cancer Neg Hx      Allergies as of 07/31/2018   No Known Allergies     Medication List        Accurate as of 07/31/18  6:19 PM. Always use  your most recent med list.          cetirizine 10 MG tablet Commonly known as:  ZYRTEC Take 10 mg by mouth daily.   cholecalciferol 1000 units tablet Commonly known as:  VITAMIN D Take 1,000 Units by mouth daily.   FISH OIL PO Take 1 tablet by mouth daily.   omeprazole 20 MG tablet Commonly known as:  PRILOSEC OTC Take 20 mg by mouth daily.   sodium fluoride-calcium carbonate 8.3-364 MG Caps capsule Commonly known as:  FLORICAL Take 1 capsule by mouth 2 (two) times daily.          Objective:   Physical Exam BP 122/74 (BP Location: Left Arm, Patient Position: Sitting, Cuff Size: Small)   Pulse 66   Temp 97.8 F (36.6 C) (Oral)   Resp 16   Ht 5\' 3"  (1.6 m)   Wt 125 lb 2 oz (56.8 kg)   SpO2 98%   BMI 22.16 kg/m  General: Well developed, NAD, see BMI.  Neck: No  thyromegaly  HEENT:  Normocephalic . Face symmetric, atraumatic.  Nose not congested.  Throat symmetric. Lungs:  CTA B Normal respiratory effort, no intercostal retractions, no accessory muscle use. Heart: RRR,  no  murmur.  No pretibial edema bilaterally  Abdomen:  Not distended, soft, non-tender. No rebound or rigidity.   Skin: Exposed areas without rash. Not pale. Not jaundice Neurologic:  alert & oriented X3.  Speech normal, gait appropriate for age and unassisted Strength symmetric and appropriate for age.  Psych: Cognition and judgment appear intact.  Cooperative with normal attention span and concentration.  Behavior appropriate. No anxious or depressed appearing.     Assessment & Plan:   Assessment  GERD Dyslipidemia Pre-menopausal, irregular periods  RLS Migraine headaches HOH , on Florical to prevent calcium deposits in the middle ear EGD 02-2015 wnl, BX negative  Plan GERD: Controlled on PPIs Dyslipidemia: Diet controlled, checking labs. RTC 1 year

## 2018-07-31 NOTE — Assessment & Plan Note (Signed)
GERD: Controlled on PPIs Dyslipidemia: Diet controlled, checking labs. RTC 1 year

## 2018-09-04 ENCOUNTER — Telehealth: Payer: PRIVATE HEALTH INSURANCE | Admitting: Family

## 2018-09-04 DIAGNOSIS — J019 Acute sinusitis, unspecified: Secondary | ICD-10-CM

## 2018-09-04 MED ORDER — AMOXICILLIN-POT CLAVULANATE 875-125 MG PO TABS: 1.0000 | ORAL_TABLET | Freq: Two times a day (BID) | ORAL | 0 refills | Status: DC

## 2018-09-04 NOTE — Progress Notes (Signed)
We are sorry that you are not feeling well.  Here is how we plan to help!  Based on what you have shared with me it looks like you have sinusitis.  Sinusitis is inflammation and infection in the sinus cavities of the head.  Based on your presentation I believe you most likely have Acute Bacterial Sinusitis.  This is an infection caused by bacteria and is treated with antibiotics. I have prescribed Augmentin 875mg/125mg one tablet twice daily with food, for 7 days. You may use an oral decongestant such as Mucinex D or if you have glaucoma or high blood pressure use plain Mucinex. Saline nasal spray help and can safely be used as often as needed for congestion.  If you develop worsening sinus pain, fever or notice severe headache and vision changes, or if symptoms are not better after completion of antibiotic, please schedule an appointment with a health care provider.    Sinus infections are not as easily transmitted as other respiratory infection, however we still recommend that you avoid close contact with loved ones, especially the very [place] and elderly.  Remember to wash your hands thoroughly throughout the day as this is the number one way to prevent the spread of infection!  Home Care:  Only take medications as instructed by your medical team.  Complete the entire course of an antibiotic.  Do not take these medications with alcohol.  A steam or ultrasonic humidifier can help congestion.  You can place a towel over your head and breathe in the steam from hot water coming from a faucet.  Avoid close contacts especially the very [place] and the elderly.  Cover your mouth when you cough or sneeze.  Always remember to wash your hands.  Get Help Right Away If:  You develop worsening fever or sinus pain.  You develop a severe head ache or visual changes.  Your symptoms persist after you have completed your treatment plan.  Make sure you  Understand these instructions.  Will watch your  condition.  Will get help right away if you are not doing well or get worse.  Your e-visit answers were reviewed by a board certified advanced clinical practitioner to complete your personal care plan.  Depending on the condition, your plan could have included both over the counter or prescription medications.  If there is a problem please reply  once you have received a response from your provider.  Your safety is important to us.  If you have drug allergies check your prescription carefully.    You can use MyChart to ask questions about today's visit, request a non-urgent call back, or ask for a work or school excuse for 24 hours related to this e-Visit. If it has been greater than 24 hours you will need to follow up with your provider, or enter a new e-Visit to address those concerns.  You will get an e-mail in the next two days asking about your experience.  I hope that your e-visit has been valuable and will speed your recovery. Thank you for using e-visits.    

## 2018-09-07 ENCOUNTER — Telehealth: Payer: Self-pay

## 2018-09-07 MED ORDER — AZELASTINE HCL 0.1 % NA SOLN: 2.0000 | Freq: Two times a day (BID) | NASAL | 12 refills | Status: DC | PRN

## 2018-09-07 NOTE — Telephone Encounter (Signed)
Spoke w/ Pt- informed of recommendations. Astelin sent to [facility]. Informed if not better or gets worse needs OV. Pt verbalized understanding.

## 2018-09-07 NOTE — Telephone Encounter (Signed)
Pt had E-visit on 09/04/2018. Please advise.

## 2018-09-07 NOTE — Telephone Encounter (Signed)
Was prescribed Augmentin through ED visit, DX was sinusitis. Advised patient: Recommend to add -Flonase 2 sprays on each side of the nose daily -Astelin 2 sprays on each side of the nose twice a day, send a prescription - use sprays until she feels better. Rest, fluids, Tylenol If she is not gradually better please come back to the office.

## 2018-09-07 NOTE — Telephone Encounter (Signed)
Copied from [place] [id number]. Topic: General - Other >> Sep 07, 2018  8:07 AM Keene Breath wrote: Reason for CRM: Patient called to speak with the nurse to let her know that the medication that the doctor prescribed at her appointment the other day is not working (amoxicillin-clavulanate (AUGMENTIN) 875-125 MG tablet).  Patient said that she is still coughing up brown mucus, and still has pressure between her eyes.  Patient would like for the nurse to call her back or have the doctor prescribe something different.  Please advise.  CB# [phone]

## 2018-12-06 ENCOUNTER — Other Ambulatory Visit: Payer: Self-pay | Admitting: Obstetrics and Gynecology

## 2018-12-06 DIAGNOSIS — Z1231 Encounter for screening mammogram for malignant neoplasm of breast: Secondary | ICD-10-CM

## 2019-01-12 ENCOUNTER — Ambulatory Visit
Admission: RE | Admit: 2019-01-12 | Discharge: 2019-01-12 | Disposition: A | Discharge status: Home / Self Care | Payer: PRIVATE HEALTH INSURANCE | Source: Ambulatory Visit | Attending: Obstetrics and Gynecology | Admitting: Obstetrics and Gynecology

## 2019-01-12 DIAGNOSIS — Z1231 Encounter for screening mammogram for malignant neoplasm of breast: Secondary | ICD-10-CM

## 2019-06-15 ENCOUNTER — Other Ambulatory Visit: Payer: Self-pay

## 2019-06-25 ENCOUNTER — Ambulatory Visit (INDEPENDENT_AMBULATORY_CARE_PROVIDER_SITE_OTHER): Payer: Managed Care, Other (non HMO) | Admitting: Internal Medicine

## 2019-06-25 ENCOUNTER — Other Ambulatory Visit: Payer: Self-pay

## 2019-06-25 ENCOUNTER — Encounter: Payer: Self-pay | Admitting: Internal Medicine

## 2019-06-25 VITALS — BP 113/72 | HR 65 | Temp 96.9°F | Resp 16 | Ht 63.0 in | Wt 127.1 lb

## 2019-06-25 DIAGNOSIS — M1990 Unspecified osteoarthritis, unspecified site: Secondary | ICD-10-CM | POA: Diagnosis not present

## 2019-06-25 DIAGNOSIS — Z0000 Encounter for general adult medical examination without abnormal findings: Secondary | ICD-10-CM | POA: Diagnosis not present

## 2019-06-25 DIAGNOSIS — E785 Hyperlipidemia, unspecified: Secondary | ICD-10-CM | POA: Diagnosis not present

## 2019-06-25 LAB — COMPREHENSIVE METABOLIC PANEL
ALT: 18 U/L (ref 0–35)
AST: 19 U/L (ref 0–37)
Albumin: 4.4 g/dL (ref 3.5–5.2)
Alkaline Phosphatase: 64 U/L (ref 39–117)
BUN: 15 mg/dL (ref 6–23)
CO2: 29 mEq/L (ref 19–32)
Calcium: 9.5 mg/dL (ref 8.4–10.5)
Chloride: 105 mEq/L (ref 96–112)
Creatinine, Ser: 0.77 mg/dL (ref 0.40–1.20)
GFR: 77.16 mL/min (ref >60.00)
Glucose, Bld: 85 mg/dL (ref 70–99)
Potassium: 4.2 mEq/L (ref 3.5–5.1)
Sodium: 142 mEq/L (ref 135–145)
Total Bilirubin: 0.7 mg/dL (ref 0.2–1.2)
Total Protein: 6.8 g/dL (ref 6.0–8.3)

## 2019-06-25 LAB — CBC WITH DIFFERENTIAL/PLATELET
Basophils Absolute: 0 10*3/uL (ref 0.0–0.1)
Basophils Relative: 0.6 % (ref 0.0–3.0)
Eosinophils Absolute: 0.1 10*3/uL (ref 0.0–0.7)
Eosinophils Relative: 2.7 % (ref 0.0–5.0)
HCT: 40 % (ref 36.0–46.0)
Hemoglobin: 13.8 g/dL (ref 12.0–15.0)
Lymphocytes Relative: 43.9 % (ref 12.0–46.0)
Lymphs Abs: 1.5 10*3/uL (ref 0.7–4.0)
MCHC: 34.4 g/dL (ref 30.0–36.0)
MCV: 91.8 fl (ref 78.0–100.0)
Monocytes Absolute: 0.3 10*3/uL (ref 0.1–1.0)
Monocytes Relative: 9.2 % (ref 3.0–12.0)
Neutro Abs: 1.5 10*3/uL (ref 1.4–7.7)
Neutrophils Relative %: 43.6 % (ref 43.0–77.0)
Platelets: 199 10*3/uL (ref 150.0–400.0)
RBC: 4.36 Mil/uL (ref 3.87–5.11)
RDW: 12.8 % (ref 11.5–15.5)
WBC: 3.5 10*3/uL — ABNORMAL LOW (ref 4.0–10.5)

## 2019-06-25 LAB — LIPID PANEL
Cholesterol: 217 mg/dL — ABNORMAL HIGH (ref 0–200)
HDL: 59.5 mg/dL (ref >39.00)
LDL Cholesterol: 139 mg/dL — ABNORMAL HIGH (ref 0–99)
NonHDL: 157.02
Total CHOL/HDL Ratio: 4
Triglycerides: 88 mg/dL (ref 0.0–149.0)
VLDL: 17.6 mg/dL (ref 0.0–40.0)

## 2019-06-25 LAB — TSH: TSH: 2.31 u[IU]/mL (ref 0.35–4.50)

## 2019-06-25 LAB — SEDIMENTATION RATE: Sed Rate: 5 mm/hr (ref 0–30)

## 2019-06-25 LAB — VITAMIN B12: Vitamin B-12: 659 pg/mL (ref 211–911)

## 2019-06-25 LAB — VITAMIN D 25 HYDROXY (VIT D DEFICIENCY, FRACTURES): VITD: 60.77 ng/mL (ref 30.00–100.00)

## 2019-06-25 NOTE — Assessment & Plan Note (Addendum)
-   TD 2015 -shingrix d/w pt  - flu shot recommended  -Female care: per Dr. Garwin Brothers: next appointment by 10/2019 -CCS: Colonoscopy 02-2011, no polyps, biopsy negative. Next should be 10 years. -Diet and exercise: Doing great. -Labs: CMP, FLP, CBC, sed rate, ANA, RF, TSH, B12, vitamin D

## 2019-06-25 NOTE — Progress Notes (Signed)
Pre visit review using our clinic review tool, if applicable. No additional management support is needed unless otherwise documented below in the visit note. 

## 2019-06-25 NOTE — Patient Instructions (Signed)
GO TO THE LAB : Get the blood work     GO TO THE FRONT DESK Schedule your next appointment   for a physical exam in 1 year 

## 2019-06-25 NOTE — Progress Notes (Signed)
Subjective:    Patient ID: Heather Collins, female    DOB: Mar 01, 1961, 58 y.o.   MRN: [id number]  DOS:  06/25/2019 Type of visit - description: CPX  Has other concerns: Numbness in the first and second digits bilaterally, mostly at night. Also 2 months history of pain in the hands and feet. Mostly located at the base on the fingers and PIPs throughout. Saw hand surgery, x-rays were done, was diagnosed with DJD. Patient remains concerned. Also occasionally has bilateral ankle pain and swelling.    Review of Systems Specifically denies fever chills, no headaches or weight loss. No myalgias  Other than above, a 14 point review of systems is negative    Past Medical History:  Diagnosis Date  . Carpal tunnel syndrome   . Esophageal reflux   . Hiatal hernia   . Migraine, unspecified, without mention of intractable migraine without mention of status migrainosus   . 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 Otosclerosis involving oval window, nonobliterative    Bilateral  . Restless legs syndrome (RLS)     Past Surgical History:  Procedure Laterality Date  . STAPEDECTOMY Left 2016   Dr. Cresenciano Lick, with a 4.0 mm titanium Quentin Cornwall prosthesis with a large well and narrow shaft over a vein graft  . STAPEDECTOMY Right 2008    Social History   Socioeconomic History  . Marital status: Married    Spouse name: Not on file  . Number of children: 1  . Years of education: Not on file  . Highest education level: Not on file  Occupational History  . Occupation: Government social research officer Records     Employer: [employer]  Social Needs  . Financial resource strain: Not on file  . Food insecurity    Worry: Not on file    Inability: Not on file  . Transportation needs    Medical: Not on file    Non-medical: Not on file  Tobacco Use  . Smoking status: Former Research scientist (life sciences)  . Smokeless tobacco: Never Used  . Tobacco comment:  quit in the 90s  Substance and Sexual Activity  . Alcohol use: No  . Drug use: No  . Sexual activity: Not on file  Lifestyle  . Physical activity    Days per week: Not on file    Minutes per session: Not on file  . Stress: Not on file  Relationships  . Social Herbalist on phone: Not on file    Gets together: Not on file    Attends religious service: Not on file    Active member of club or organization: Not on file    Attends meetings of clubs or organizations: Not on file    Relationship status: Not on file  . Intimate partner violence    Fear of current or ex partner: Not on file    Emotionally abused: Not on file    Physically abused: Not on file    Forced sexual activity: Not on file  Other Topics Concern  . Not on file  Social History Narrative   Son,  1997, going to college     Family History  Problem Relation Age of Onset  . Heart disease Mother 5       at age 70  . Lung cancer Father   . Coronary artery disease Paternal Uncle   . Hypertension Other  siblings  . Colon cancer Neg Hx   . Breast cancer Neg Hx      Allergies as of 06/25/2019   No Known Allergies     Medication List       Accurate as of June 25, 2019 11:59 PM. If you have any questions, ask your nurse or doctor.        STOP taking these medications   amoxicillin-clavulanate 875-125 MG tablet Commonly known as: AUGMENTIN Stopped by: Kathlene November, MD   azelastine 0.1 % nasal spray Commonly known as: ASTELIN Stopped by: Kathlene November, MD   omeprazole 20 MG tablet Commonly known as: PRILOSEC OTC Stopped by: Kathlene November, MD     TAKE these medications   cetirizine 10 MG tablet Commonly known as: ZYRTEC Take 10 mg by mouth daily.   cholecalciferol 1000 units tablet Commonly known as: VITAMIN D Take 1,000 Units by mouth daily.   FISH OIL PO Take 1 tablet by mouth daily.   folic acid 022 MCG tablet Commonly known as: FOLVITE Take 400 mcg by mouth daily.   ICAPS AREDS  FORMULA PO Take by mouth.   MiraLax 17 g packet Generic drug: polyethylene glycol Take 17 g by mouth daily.   sodium fluoride-calcium carbonate 8.3-364 MG Caps capsule Commonly known as: FLORICAL Take 1 capsule by mouth 2 (two) times daily.   TURMERIC PO Take by mouth.           Objective:   Physical Exam BP 113/72 (BP Location: Left Arm, Patient Position: Sitting, Cuff Size: Small)   Pulse 65   Temp (!) 96.9 F (36.1 C) (Temporal)   Resp 16   Ht 5\' 3"  (1.6 m)   Wt 127 lb 2 oz (57.7 kg)   SpO2 100%   BMI 22.52 kg/m  General: Well developed, NAD, BMI noted Neck: No  thyromegaly  HEENT:  Normocephalic . Face symmetric, atraumatic Lungs:  CTA B Normal respiratory effort, no intercostal retractions, no accessory muscle use. Heart: RRR,  no murmur.  No pretibial edema bilaterally  Abdomen:  Not distended, soft, non-tender. No rebound or rigidity. MSK: Hands and wrists: No synovitis noted, does have bony changes consistent with DJD.  The PIP at the ring finger bilaterally seems to be particularly large, minimal swelling? Feet and ankle: Toes with changes consistent with DJD otherwise negative Skin: Exposed areas without rash. Not pale. Not jaundice Neurologic:  alert & oriented X3.  Speech normal, gait appropriate for age and unassisted Strength symmetric and appropriate for age.  Psych: Cognition and judgment appear intact.  Cooperative with normal attention span and concentration.  Behavior appropriate. No anxious or depressed appearing.     Assessment     Assessment  GERD Dyslipidemia Pre-menopausal, irregular periods  RLS Migraine headaches HOH , on Florical to prevent calcium deposits in the middle ear EGD 02-2015 wnl, BX negative  Plan: Dyslipidemia: Diet controlled, checking labs DJD: Pain at the hands and feet likely due to DJD, patient remains extremely concerned and bothered by that. Hand x-rays were done at Ortho and reportedly showed only  DJD. We will get a sed rate, ANA, RF, vitamin D and B12.  Refer to rheumatology.  Patient remains concerned after talking to me. Paresthesias hands: As described above, decrease with 90 splinters, possibly CTS, continue with the splinters RTC 1 year  Today in addition to the CPX, I spent more than 20   min with the patient: >50% of the time counseling regards her DJD symptoms

## 2019-06-26 ENCOUNTER — Telehealth: Payer: Self-pay | Admitting: Internal Medicine

## 2019-06-26 LAB — RHEUMATOID FACTOR: Rheumatoid fact SerPl-aCnc: <14 IU/mL (ref <14)

## 2019-06-26 LAB — ANA: Anti Nuclear Antibody (ANA): NEGATIVE

## 2019-06-26 NOTE — Telephone Encounter (Signed)
Pt dropped off copy of medical record for [facility] for provider to have on pt's chart. Document put at front office tray under providers name.

## 2019-06-26 NOTE — Assessment & Plan Note (Signed)
Dyslipidemia: Diet controlled, checking labs DJD: Pain at the hands and feet likely due to DJD, patient remains extremely concerned and bothered by that. Hand x-rays were done at Ortho and reportedly showed only DJD. We will get a sed rate, ANA, RF, vitamin D and B12.  Refer to rheumatology.  Patient remains concerned after talking to me. Paresthesias hands: As described above, decrease with 90 splinters, possibly CTS, continue with the splinters RTC 1 year

## 2019-06-26 NOTE — Telephone Encounter (Signed)
Placed in MD red folder.

## 2019-06-26 NOTE — Telephone Encounter (Signed)
Records sent for scanning

## 2019-06-26 NOTE — Telephone Encounter (Signed)
Records from 05/31/2019 reviewed. They had a long discussion about hands DJD treatment including conservative versus surgical modalities. X-rays were done. She also has intermittent bilateral carpal tunnel syndrome symptoms and was recommended splinters.

## 2019-07-18 NOTE — Progress Notes (Signed)
Office Visit Note  Patient: Heather Collins             Date of Birth: [birth year]           MRN: [id number]             PCP: Colon Branch, MD Referring: Colon Branch, MD Visit Date: 08/01/2019 Occupation: Lead tech at [employer]  Subjective:  Pain and swelling in hands.   History of Present Illness: Heather Collins is a 58 y.o. female seen in consultation per request of her PCP.  According to patient she has noticed some prominence of her PIP joints this year and also some inflammation.  She states when she was on for low she was painting in the house and she started experiencing increased pain and stiffness in her hands.  She went to see Dr. Amedeo Plenty who did x-ray of her hands and told her she had osteoarthritis.  She was also experiencing some numbness in her hands and he told her to wear carpal tunnel syndrome braces.  She did not have nerve conduction test.  She states she has been wearing braces at night and has noted some improvement in her symptoms.  She also complains of some discomfort in her left ankle but no joint swelling.  None of the other joints are painful.  She denies any family history of autoimmune disease.  Activities of Daily Living:  Patient reports morning stiffness for a few minutes.   Patient Reports nocturnal pain.  Difficulty dressing/grooming: Denies Difficulty climbing stairs: Denies Difficulty getting out of chair: Denies Difficulty using hands for taps, buttons, cutlery, and/or writing: Denies  Review of Systems  Constitutional: Negative for fatigue, night sweats, weight gain and weight loss.  HENT: Negative for mouth sores, trouble swallowing, trouble swallowing, mouth dryness and nose dryness.   Eyes: Negative for pain, redness, itching, visual disturbance and dryness.  Respiratory: Negative for cough, shortness of breath, wheezing and difficulty breathing.   Cardiovascular: Negative for chest pain, palpitations, hypertension, irregular  heartbeat and swelling in legs/feet.  Gastrointestinal: Negative for abdominal pain, blood in stool, constipation and diarrhea.  Endocrine: Negative for increased urination.  Genitourinary: Negative for difficulty urinating, painful urination and vaginal dryness.  Musculoskeletal: Positive for arthralgias, joint pain and morning stiffness. Negative for joint swelling, myalgias, muscle weakness, muscle tenderness and myalgias.  Skin: Negative for color change, rash, hair loss, skin tightness, ulcers and sensitivity to sunlight.  Allergic/Immunologic: Negative for susceptible to infections.  Neurological: Positive for numbness. Negative for dizziness, headaches, memory loss, night sweats and weakness.  Hematological: Negative for bruising/bleeding tendency and swollen glands.  Psychiatric/Behavioral: Negative for depressed mood, confusion and sleep disturbance. The patient is not nervous/anxious.     PMFS History:  Patient Active Problem List   Diagnosis Date Noted  . Annual physical exam 05/18/2017  . PCP NOTES >>>>>>>>>>>>>> 05/18/2017  . Contact dermatitis due to poison ivy 07/07/2013  . Dizziness and giddiness 05/25/2013  . Dyslipidemia 04/11/2012  . Left upper quadrant pain 04/11/2012  . CARPAL TUNNEL SYNDROME 01/01/2011  . RESTLESS LEG SYNDROME 11/25/2007  . MIGRAINE HEADACHE 11/25/2007  . G E R D 05/31/2007    Past Medical History:  Diagnosis Date  . Carpal tunnel syndrome   . Esophageal reflux   . Hiatal hernia   . Migraine, unspecified, without mention of intractable migraine without mention of status migrainosus   . 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 Otosclerosis involving oval window, nonobliterative    Bilateral  . Restless legs syndrome (RLS)     Family History  Problem Relation Age of Onset  . Heart disease Mother 15       at age 23  . Lung cancer Father   . Coronary artery disease  Paternal Uncle   . Diabetes Sister   . Healthy Brother   . Healthy Brother   . Healthy Son   . Colon cancer Neg Hx   . Breast cancer Neg Hx    Past Surgical History:  Procedure Laterality Date  . STAPEDECTOMY Left 2016   Dr. Cresenciano Lick, with a 4.0 mm titanium Quentin Cornwall prosthesis with a large well and narrow shaft over a vein graft  . STAPEDECTOMY Right 2008   Social History   Social History Narrative   Son,  1997, going to college   Immunization History  Administered Date(s) Administered  . DTaP 04/04/2013  . Influenza Whole 09/11/2012  . Influenza-Unspecified 08/08/2016, 08/03/2017  . Td 11/08/2013     Objective: Vital Signs: BP 110/70 (BP Location: Right Arm, Patient Position: Sitting, Cuff Size: Normal)   Pulse 64   Resp 13   Ht 5' 2.5" (1.588 m)   Wt 128 lb (58.1 kg)   BMI 23.04 kg/m    Physical Exam Vitals signs and nursing note reviewed.  Constitutional:      Appearance: She is well-developed.  HENT:     Head: Normocephalic and atraumatic.  Eyes:     Conjunctiva/sclera: Conjunctivae normal.  Neck:     Musculoskeletal: Normal range of motion.  Cardiovascular:     Rate and Rhythm: Normal rate and regular rhythm.     Heart sounds: Normal heart sounds.  Pulmonary:     Effort: Pulmonary effort is normal.     Breath sounds: Normal breath sounds.  Abdominal:     General: Bowel sounds are normal.     Palpations: Abdomen is soft.  Lymphadenopathy:     Cervical: No cervical adenopathy.  Skin:    General: Skin is warm and dry.     Capillary Refill: Capillary refill takes less than 2 seconds.     Comments: She has a patch of erythema over the nape of her neck with some dry skin.  It could be seborrhea.  I have advised her to discuss that with her dermatologist to make sure is not psoriasis.  No nail pitting was noted.  No psoriasis patches were noted rest of her body.  Neurological:     Mental Status: She is alert and oriented to person, place, and time.   Psychiatric:        Behavior: Behavior normal.      Musculoskeletal Exam: C-spine thoracic and lumbar spine with good range of motion.  Shoulder joints elbow joints wrist joints with good range of motion.  She has PIP and DIP thickening with some inflammatory changes.  She has incomplete flexion of her right third PIP joint which was swollen.  Hip joints, knee joints, ankles, MTPs and PIPs were in good range of motion.  She has some dorsal spurring and mild thickening of PIPs in her feet.  CDAI Exam: CDAI Score: - Patient Global: -; Provider Global: - Swollen: 6 ; Tender: 6  Joint Exam      Right  Left  PIP 2     Swollen Tender  PIP 3  Swollen Tender  Swollen Tender  PIP 5  Swollen Tender     DIP 4  Swollen Tender     DIP 5  Swollen Tender        Investigation: Findings:  06/25/19: ANA negative, RF<14, sed rate 5, TSH 2.31, Vitamin D 60.77, Vitamin B12 659  Component     Latest Ref Rng & Units 06/25/2019  TSH     0.35 - 4.50 uIU/mL 2.31  Sed Rate     0 - 30 mm/hr 5  Anti Nuclear Antibody (ANA)     NEGATIVE NEGATIVE  RA Latex Turbid.     <14 IU/mL <14  VITD     30.00 - 100.00 ng/mL 60.77  Vitamin B12     211 - 911 pg/mL 659   Imaging: No results found.  Recent Labs: Lab Results  Component Value Date   WBC 3.5 (L) 06/25/2019   HGB 13.8 06/25/2019   PLT 199.0 06/25/2019   NA 142 06/25/2019   K 4.2 06/25/2019   CL 105 06/25/2019   CO2 29 06/25/2019   GLUCOSE 85 06/25/2019   BUN 15 06/25/2019   CREATININE 0.77 06/25/2019   BILITOT 0.7 06/25/2019   ALKPHOS 64 06/25/2019   AST 19 06/25/2019   ALT 18 06/25/2019   PROT 6.8 06/25/2019   ALBUMIN 4.4 06/25/2019   CALCIUM 9.5 06/25/2019   GFRAA 100 09/14/2007    Speciality Comments: No specialty comments available.  Procedures:  No procedures performed Allergies: Patient has no known allergies.   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  There is history of osteoarthritis in her mother as well.  Patient had x-rays done by Dr. Amedeo Plenty about 2 months ago.  She will bring those x-rays at the follow-up visit.  She has incomplete extension of her right third PIP joint.  She may benefit from cortisone injection but she declined.  I have given her a list of natural anti-inflammatories and hand muscle strengthening exercises.  I have also advised her to not lift more than 5 pounds at work.  As it causes increased discomfort in her hands.  We will try meloxicam 15 mg p.o. daily for 30 days.  Side effects including the risk of gastritis GI bleed renal or hepatic toxicity was discussed at length.  Her most recent labs were normal.  She will take the medication with food.  I would like to see the response to anti-inflammatory over 1 month.  06/25/19: ANA negative, RF<14, sed rate 5, TSH 2.31, Vitamin D 60.77, Vitamin B12 659  Bilateral carpal tunnel syndrome-patient states that she was diagnosed with bilateral carpal tunnel syndrome by Dr. Amedeo Plenty.  She did not have nerve conduction test.  She has been using carpal tunnel braces at night which is been helpful to some extent.  Currently she does not have any symptoms of carpal tunnel syndrome.  Rash-she has a patch of erythema and some dry scales on her scalp at the nape of the neck.  I have advised her to review this with the dermatologist although it appears to be most likely seborrhea.  No nail pitting was noted.  No other patches were noted on her skin examination.  RLS (restless legs syndrome)-patient states that she takes Aleve on PRN basis.  Have advised not to mix it with Mobic.  Dyslipidemia-she has elevated LDL.  History of gastroesophageal reflux (GERD)-she has  history of reflux which is not bothersome currently.  Have advised her to monitor very closely while she is taking Mobic.  Hx of migraines   Orders: No orders of the defined types were placed in this encounter.  Meds ordered this encounter  Medications  . meloxicam (MOBIC) 15 MG tablet    Sig: Take 1 tablet (15 mg total) by mouth daily.    Dispense:  30 tablet    Refill:  0    Face-to-face time spent with patient was 45 minutes. Greater than 50% of time was spent in counseling and coordination of care.  Follow-Up Instructions: Return in about 4 weeks (around 08/29/2019) for Osteoarthritis.   Bo Merino, MD  Note - This record has been created using Editor, commissioning.  Chart creation errors have been sought, but may not always  have been located. Such creation errors do not reflect on  the standard of medical care.

## 2019-08-01 ENCOUNTER — Encounter: Payer: Self-pay | Admitting: Rheumatology

## 2019-08-01 ENCOUNTER — Ambulatory Visit: Payer: Managed Care, Other (non HMO) | Admitting: Rheumatology

## 2019-08-01 ENCOUNTER — Other Ambulatory Visit: Payer: Self-pay

## 2019-08-01 VITALS — BP 110/70 | HR 64 | Resp 13 | Ht 62.5 in | Wt 128.0 lb

## 2019-08-01 DIAGNOSIS — M79642 Pain in left hand: Secondary | ICD-10-CM

## 2019-08-01 DIAGNOSIS — R21 Rash and other nonspecific skin eruption: Secondary | ICD-10-CM

## 2019-08-01 DIAGNOSIS — M79641 Pain in right hand: Secondary | ICD-10-CM

## 2019-08-01 DIAGNOSIS — Z8719 Personal history of other diseases of the digestive system: Secondary | ICD-10-CM

## 2019-08-01 DIAGNOSIS — G5603 Carpal tunnel syndrome, bilateral upper limbs: Secondary | ICD-10-CM | POA: Diagnosis not present

## 2019-08-01 DIAGNOSIS — G2581 Restless legs syndrome: Secondary | ICD-10-CM

## 2019-08-01 DIAGNOSIS — E785 Hyperlipidemia, unspecified: Secondary | ICD-10-CM | POA: Diagnosis not present

## 2019-08-01 DIAGNOSIS — Z8669 Personal history of other diseases of the nervous system and sense organs: Secondary | ICD-10-CM

## 2019-08-01 MED ORDER — MELOXICAM 15 MG PO TABS: 15.0000 mg | ORAL_TABLET | Freq: Every day | ORAL | 0 refills | Status: DC

## 2019-08-01 NOTE — Patient Instructions (Signed)
Hand Exercises °Hand exercises can be helpful for almost anyone. These exercises can strengthen the hands, improve flexibility and movement, and increase blood flow to the hands. These results can make work and daily tasks easier. Hand exercises can be especially helpful for people who have joint pain from arthritis or have nerve damage from overuse (carpal tunnel syndrome). These exercises can also help people who have injured a hand. °Exercises °Most of these hand exercises are gentle stretching and motion exercises. It is usually safe to do them often throughout the day. Warming up your hands before exercise may help to reduce stiffness. You can do this with gentle massage or by placing your hands in warm water for 10-15 minutes. °It is normal to feel some stretching, pulling, tightness, or mild discomfort as you begin new exercises. This will gradually improve. Stop an exercise right away if you feel sudden, severe pain or your pain gets worse. Ask your health care provider which exercises are best for you. °Knuckle bend or "claw" fist °1. Stand or sit with your arm, hand, and all five fingers pointed straight up. Make sure to keep your wrist straight during the exercise. °2. Gently bend your fingers down toward your palm until the tips of your fingers are touching the top of your palm. Keep your big knuckle straight and just bend the small knuckles in your fingers. °3. Hold this position for __________ seconds. °4. Straighten (extend) your fingers back to the starting position. °Repeat this exercise 5-10 times with each hand. °Full finger fist °1. Stand or sit with your arm, hand, and all five fingers pointed straight up. Make sure to keep your wrist straight during the exercise. °2. Gently bend your fingers into your palm until the tips of your fingers are touching the middle of your palm. °3. Hold this position for __________ seconds. °4. Extend your fingers back to the starting position, stretching every  joint fully. °Repeat this exercise 5-10 times with each hand. °Straight fist °1. Stand or sit with your arm, hand, and all five fingers pointed straight up. Make sure to keep your wrist straight during the exercise. °2. Gently bend your fingers at the big knuckle, where your fingers meet your hand, and the middle knuckle. Keep the knuckle at the tips of your fingers straight and try to touch the bottom of your palm. °3. Hold this position for __________ seconds. °4. Extend your fingers back to the starting position, stretching every joint fully. °Repeat this exercise 5-10 times with each hand. °Tabletop °1. Stand or sit with your arm, hand, and all five fingers pointed straight up. Make sure to keep your wrist straight during the exercise. °2. Gently bend your fingers at the big knuckle, where your fingers meet your hand, as far down as you can while keeping the small knuckles in your fingers straight. Think of forming a tabletop with your fingers. °3. Hold this position for __________ seconds. °4. Extend your fingers back to the starting position, stretching every joint fully. °Repeat this exercise 5-10 times with each hand. °Finger spread °1. Place your hand flat on a table with your palm facing down. Make sure your wrist stays straight as you do this exercise. °2. Spread your fingers and thumb apart from each other as far as you can until you feel a gentle stretch. Hold this position for __________ seconds. °3. Bring your fingers and thumb tight together again. Hold this position for __________ seconds. °Repeat this exercise 5-10 times with each hand. °  Making circles °1. Stand or sit with your arm, hand, and all five fingers pointed straight up. Make sure to keep your wrist straight during the exercise. °2. Make a circle by touching the tip of your thumb to the tip of your index finger. °3. Hold for __________ seconds. Then open your hand wide. °4. Repeat this motion with your thumb and each finger on your  hand. °Repeat this exercise 5-10 times with each hand. °Thumb motion °1. Sit with your forearm resting on a table and your wrist straight. Your thumb should be facing up toward the ceiling. Keep your fingers relaxed as you move your thumb. °2. Lift your thumb up as high as you can toward the ceiling. Hold for __________ seconds. °3. Bend your thumb across your palm as far as you can, reaching the tip of your thumb for the small finger (pinkie) side of your palm. Hold for __________ seconds. °Repeat this exercise 5-10 times with each hand. °Grip strengthening ° °1. Hold a stress ball or other soft ball in the middle of your hand. °2. Slowly increase the pressure, squeezing the ball as much as you can without causing pain. Think of bringing the tips of your fingers into the middle of your palm. All of your finger joints should bend when doing this exercise. °3. Hold your squeeze for __________ seconds, then relax. °Repeat this exercise 5-10 times with each hand. °Contact a health care provider if: °· Your hand pain or discomfort gets much worse when you do an exercise. °· Your hand pain or discomfort does not improve within 2 hours after you exercise. °If you have any of these problems, stop doing these exercises right away. Do not do them again unless your health care provider says that you can. °Get help right away if: °· You develop sudden, severe hand pain or swelling. If this happens, stop doing these exercises right away. Do not do them again unless your health care provider says that you can. °This information is not intended to replace advice given to you by your health care provider. Make sure you discuss any questions you have with your health care provider. °Document Released: 10/06/2015 Document Revised: 02/15/2019 Document Reviewed: 10/26/2018 °[employer] Patient Education © 2020 [employer] ° °

## 2019-08-02 ENCOUNTER — Ambulatory Visit: Payer: PRIVATE HEALTH INSURANCE | Admitting: Internal Medicine

## 2019-08-14 NOTE — Progress Notes (Signed)
Office Visit Note  Patient: Heather Collins             Date of Birth: [birth year]           MRN: [id number]             PCP: Colon Branch, MD Referring: Colon Branch, MD Visit Date: 08/28/2019 Occupation: @GUAROCC @  Subjective:  Hand stiffness   History of Present Illness: Heather Collins is a 58 y.o. female with history of osteoarthritis.  She reports that since starting on meloxicam 15 mg by mouth daily she has no significant improvement in the discomfort and swelling in her hands.  She continues to have some stiffness in both hands especially in the middle the night and first thing in the morning for several minutes.  She is also started to take natural anti-inflammatories which she believes have helped.  She has been tolerating both medications without any side effects.  She states that her symptoms of carpal tunnel have also subsided since wearing the carpal tunnel braces.  She has been performing hand exercises on a regular basis. She states that she followed up with a dermatologist for the rash that was on her scalp and neck was diagnosed with stork bite.    Activities of Daily Living:  Patient reports morning stiffness for 2   minutes.   Patient Reports nocturnal pain.  Difficulty dressing/grooming: Denies Difficulty climbing stairs: Denies Difficulty getting out of chair: Denies Difficulty using hands for taps, buttons, cutlery, and/or writing: Denies  Review of Systems  Constitutional: Negative for fatigue.  HENT: Negative for mouth sores, mouth dryness and nose dryness.   Eyes: Negative for pain, visual disturbance and dryness.  Respiratory: Negative for cough, hemoptysis, shortness of breath and difficulty breathing.   Cardiovascular: Negative for chest pain, palpitations, hypertension and swelling in legs/feet.  Gastrointestinal: Negative for blood in stool, constipation and diarrhea.  Endocrine: Negative for increased urination.  Genitourinary: Negative for painful  urination.  Musculoskeletal: Positive for arthralgias, joint pain and morning stiffness. Negative for joint swelling, myalgias, muscle weakness, muscle tenderness and myalgias.  Skin: Negative for color change, pallor, rash, hair loss, nodules/bumps, skin tightness, ulcers and sensitivity to sunlight.  Allergic/Immunologic: Negative for susceptible to infections.  Neurological: Negative for dizziness, numbness, headaches and weakness.  Hematological: Negative for swollen glands.  Psychiatric/Behavioral: Negative for depressed mood and sleep disturbance. The patient is not nervous/anxious.     PMFS History:  Patient Active Problem List   Diagnosis Date Noted   Annual physical exam 05/18/2017   PCP NOTES >>>>>>>>>>>>>> 05/18/2017   Contact dermatitis due to poison ivy 07/07/2013   Dizziness and giddiness 05/25/2013   Dyslipidemia 04/11/2012   Left upper quadrant pain 04/11/2012   CARPAL TUNNEL SYNDROME 01/01/2011   RESTLESS LEG SYNDROME 11/25/2007   MIGRAINE HEADACHE 11/25/2007   G E R D 05/31/2007    Past Medical History:  Diagnosis Date   Carpal tunnel syndrome    Esophageal reflux    Hiatal hernia    Migraine, unspecified, without mention of intractable migraine without mention of status migrainosus    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 Cresenciano Lick, with a 4.0 mm titanium Quentin Cornwall prosthesis with a large  well and narrow shaft over a vein graft   STAPEDECTOMY Right 2008   Social History   Social History Narrative   Son,  1997, going to college   Immunization History  Administered Date(s) Administered   DTaP 04/04/2013   Influenza Whole 09/11/2012   Influenza-Unspecified 08/08/2016, 08/03/2017   Td 11/08/2013     Objective: Vital Signs: BP 107/69 (BP Location: Right Arm, Patient Position: Sitting, Cuff Size: Normal)    Pulse 70    Resp 14    Ht 5' 2.5" (1.588 m)    Wt 129 lb (58.5 kg)    BMI 23.22 kg/m    Physical Exam Vitals signs and nursing note reviewed.  Constitutional:      Appearance: She is well-developed.  HENT:     Head: Normocephalic and atraumatic.  Eyes:     Conjunctiva/sclera: Conjunctivae normal.  Neck:     Musculoskeletal: Normal range of motion.  Cardiovascular:     Rate and Rhythm: Normal rate and regular rhythm.     Heart sounds: Normal heart sounds.  Pulmonary:     Effort: Pulmonary effort is normal.     Breath sounds: Normal breath sounds.  Abdominal:     General: Bowel sounds are normal.     Palpations: Abdomen is soft.  Lymphadenopathy:     Cervical: No cervical adenopathy.  Skin:    General: Skin is warm and dry.     Capillary Refill: Capillary refill takes less than 2 seconds.  Neurological:     Mental Status: She is alert and oriented to person, place, and time.  Psychiatric:        Behavior: Behavior normal.      Musculoskeletal Exam: C-spine, thoracic spine, lumbar spine good range of motion.  No midline spinal tenderness.  No SI joint tenderness.  Shoulder joints, elbow joints, wrist joints, and MCPs good range of motion no synovitis.  She has PIP and DIP synovial thickening consistent with osteoarthritis of both hands.  She has incomplete flexion of the right third PIP joint.  Hip joints, knee joints, ankle joints, MTPs, PIPs, DIPs good range of motion no synovitis.  No warmth or effusion of bilateral knee joints.  No tenderness or  swelling of ankle joints.  CDAI Exam: CDAI Score: -- Patient Global: --; Provider Global: -- Swollen: --; Tender: -- Joint Exam   No joint exam has been documented for this visit   There is currently no information documented on the homunculus. Go to the Rheumatology activity and complete the homunculus joint exam.  Investigation: No additional findings.  Imaging: No results found.  Recent Labs: Lab Results  Component Value Date   WBC 3.5 (L) 06/25/2019   HGB 13.8 06/25/2019   PLT 199.0 06/25/2019   NA 142 06/25/2019   K 4.2 06/25/2019   CL 105 06/25/2019   CO2 29 06/25/2019   GLUCOSE 85 06/25/2019   BUN 15 06/25/2019   CREATININE 0.77 06/25/2019   BILITOT 0.7 06/25/2019   ALKPHOS 64 06/25/2019   AST 19 06/25/2019   ALT 18  06/25/2019   PROT 6.8 06/25/2019   ALBUMIN 4.4 06/25/2019   CALCIUM 9.5 06/25/2019   GFRAA 100 09/14/2007    Speciality Comments: No specialty comments available.  Procedures:  No procedures performed Allergies: Patient has no known allergies.   Assessment / Plan:     Visit Diagnoses: Primary osteoarthritis of both hands - Hx of inflammatory changes in PIP and DIP joints.  She will bring x-rays of bilateral hands from Dr. Vanetta Shawl office, RF-, ANA-, sed rate 5: X-rays of both hands were reviewed today in the office.  She has no synovitis or dactylitis on exam.  She has PIP and DIP synovial thickening consistent with osteoarthritis of both hands.  She has limited flexion of the right third PIP joint.  She has noted significant improvement since starting on meloxicam 15 mg 1 tablet by mouth daily.  She has also started taking natural anti-inflammatories including turmeric, tart cherry, ginger, and omega-3.  She has been tolerating meloxicam and these natural anti-inflammatories without any side effects.  She has noticed improvement in the discomfort and swelling in these joints.  She continues have some morning stiffness as well as nocturnal stiffness  in her hands.  She plans on trying copper gloves.  Joint protection and muscle strengthening were discussed.  She was encouraged to continue to perform hand exercises on a regular basis.  She plans on discontinuing meloxicam.  She was advised to have lab work drawn if she continues to take meloxicam as prescribed.  She was advised to notify us if she develops increased joint pain or joint swelling.   She will follow-up in the office in 6 months.  Bilateral carpal tunnel syndrome - She has been using bilateral carpal tunnel braces.  Patient never had nerve conduction velocities.  Her symptoms have subsided since starting on meloxicam and wearing carpal tunnel braces.  Stork bites: She has a rash on the nape of her neck and was evaluated by her dermatologist.  She was diagnosed with a stork bite.  Other medical conditions are listed as follows:  History of gastroesophageal reflux (GERD) - Currently asymptomatic.  Dyslipidemia  Hx of migraines  RLS (restless legs syndrome)  Orders: No orders of the defined types were placed in this encounter.  No orders of the defined types were placed in this encounter.     Follow-Up Instructions: Return in 6 months (on 02/26/2020) for Osteoarthritis.   Ofilia Neas, PA-C   I examined and evaluated the patient with Hazel Sams PA.  She is doing much better clinically on combination of meloxicam and natural anti-inflammatories.  I advised getting labs to monitor for any side effects of anti-inflammatories.  She states she would like to try coming off meloxicam and if she has persistent symptoms then she will resume meloxicam and come in for the lab work.  The plan of care was discussed as noted above.  Bo Merino, MD  Note - This record has been created using Editor, commissioning.  Chart creation errors have been sought, but may not always  have been located. Such creation errors do not reflect on  the standard of medical care.

## 2019-08-16 ENCOUNTER — Telehealth: Payer: Self-pay | Admitting: Rheumatology

## 2019-08-16 NOTE — Telephone Encounter (Signed)
Patient dropped off a CD of xrays for Dr. Estanislado Pandy to look at. Also, patient wanted to let doctor know she had the place on her head looked at, as directed by Dr. Estanislado Pandy. It is actually a birth mark, or stork bite per evaluation.

## 2019-08-28 ENCOUNTER — Encounter: Payer: Self-pay | Admitting: Rheumatology

## 2019-08-28 ENCOUNTER — Other Ambulatory Visit: Payer: Self-pay

## 2019-08-28 ENCOUNTER — Ambulatory Visit: Payer: Managed Care, Other (non HMO) | Admitting: Rheumatology

## 2019-08-28 VITALS — BP 107/69 | HR 70 | Resp 14 | Ht 62.5 in | Wt 129.0 lb

## 2019-08-28 DIAGNOSIS — Z8719 Personal history of other diseases of the digestive system: Secondary | ICD-10-CM | POA: Diagnosis not present

## 2019-08-28 DIAGNOSIS — M19042 Primary osteoarthritis, left hand: Secondary | ICD-10-CM

## 2019-08-28 DIAGNOSIS — Q825 Congenital non-neoplastic nevus: Secondary | ICD-10-CM

## 2019-08-28 DIAGNOSIS — R21 Rash and other nonspecific skin eruption: Secondary | ICD-10-CM

## 2019-08-28 DIAGNOSIS — G2581 Restless legs syndrome: Secondary | ICD-10-CM

## 2019-08-28 DIAGNOSIS — Z8669 Personal history of other diseases of the nervous system and sense organs: Secondary | ICD-10-CM

## 2019-08-28 DIAGNOSIS — E785 Hyperlipidemia, unspecified: Secondary | ICD-10-CM

## 2019-08-28 DIAGNOSIS — M19041 Primary osteoarthritis, right hand: Secondary | ICD-10-CM

## 2019-08-28 DIAGNOSIS — G5603 Carpal tunnel syndrome, bilateral upper limbs: Secondary | ICD-10-CM

## 2019-08-28 DIAGNOSIS — Z5181 Encounter for therapeutic drug level monitoring: Secondary | ICD-10-CM

## 2019-09-11 DIAGNOSIS — H8003 Otosclerosis involving oval window, nonobliterative, bilateral: Secondary | ICD-10-CM | POA: Insufficient documentation

## 2019-09-11 DIAGNOSIS — Z9009 Acquired absence of other part of head and neck: Secondary | ICD-10-CM | POA: Insufficient documentation

## 2019-11-12 ENCOUNTER — Other Ambulatory Visit: Payer: Self-pay | Admitting: Obstetrics and Gynecology

## 2019-11-26 ENCOUNTER — Other Ambulatory Visit (HOSPITAL_COMMUNITY)
Admission: RE | Admit: 2019-11-26 | Discharge: 2019-11-26 | Disposition: A | Discharge status: Home / Self Care | Payer: Managed Care, Other (non HMO) | Source: Ambulatory Visit | Attending: Obstetrics and Gynecology | Admitting: Obstetrics and Gynecology

## 2019-11-26 DIAGNOSIS — Z20822 Contact with and (suspected) exposure to covid-19: Secondary | ICD-10-CM | POA: Insufficient documentation

## 2019-11-26 DIAGNOSIS — Z01812 Encounter for preprocedural laboratory examination: Secondary | ICD-10-CM | POA: Diagnosis present

## 2019-11-27 LAB — NOVEL CORONAVIRUS, NAA (HOSP ORDER, SEND-OUT TO REF LAB; TAT 18-24 HRS): SARS-CoV-2, NAA: NOT DETECTED

## 2019-11-28 ENCOUNTER — Encounter (HOSPITAL_COMMUNITY): Payer: Self-pay

## 2019-11-28 ENCOUNTER — Other Ambulatory Visit (HOSPITAL_COMMUNITY): Payer: Managed Care, Other (non HMO)

## 2019-11-28 ENCOUNTER — Other Ambulatory Visit: Payer: Self-pay

## 2019-11-28 ENCOUNTER — Encounter (HOSPITAL_COMMUNITY)
Admission: RE | Admit: 2019-11-28 | Discharge: 2019-11-28 | Disposition: A | Discharge status: Home / Self Care | Payer: Managed Care, Other (non HMO) | Source: Ambulatory Visit | Attending: Obstetrics and Gynecology | Admitting: Obstetrics and Gynecology

## 2019-11-28 DIAGNOSIS — Z01812 Encounter for preprocedural laboratory examination: Secondary | ICD-10-CM | POA: Diagnosis present

## 2019-11-28 DIAGNOSIS — R87619 Unspecified abnormal cytological findings in specimens from cervix uteri: Secondary | ICD-10-CM | POA: Diagnosis not present

## 2019-11-28 LAB — BASIC METABOLIC PANEL
Anion gap: 6 (ref 5–15)
BUN: 11 mg/dL (ref 6–20)
CO2: 26 mmol/L (ref 22–32)
Calcium: 9.2 mg/dL (ref 8.9–10.3)
Chloride: 107 mmol/L (ref 98–111)
Creatinine, Ser: 0.68 mg/dL (ref 0.44–1.00)
GFR calc Af Amer: >60 mL/min (ref >60)
GFR calc non Af Amer: >60 mL/min (ref >60)
Glucose, Bld: 98 mg/dL (ref 70–99)
Potassium: 4.2 mmol/L (ref 3.5–5.1)
Sodium: 139 mmol/L (ref 135–145)

## 2019-11-28 LAB — CBC
HCT: 39.7 % (ref 36.0–46.0)
Hemoglobin: 13.2 g/dL (ref 12.0–15.0)
MCH: 30.9 pg (ref 26.0–34.0)
MCHC: 33.2 g/dL (ref 30.0–36.0)
MCV: 93 fL (ref 80.0–100.0)
Platelets: 240 10*3/uL (ref 150–400)
RBC: 4.27 MIL/uL (ref 3.87–5.11)
RDW: 12.3 % (ref 11.5–15.5)
WBC: 4.2 10*3/uL (ref 4.0–10.5)
nRBC: 0 % (ref 0.0–0.2)

## 2019-11-28 LAB — ABO/RH: ABO/RH(D): O POS

## 2019-11-28 NOTE — Patient Instructions (Signed)
Heather Collins      Your procedure is scheduled on 11/29/19   Report to [place]  at 11:00 A.M.   Call this number if you have problems the morning of surgery:[phone]   OUR ADDRESS IS [place], WE ARE LOCATED [facility] THE [facility] WITH [facility].   Remember:  Do not eat food or drink liquids after midnight.   Take these medicines the morning of surgery with A SIP OF WATER: none   Do not wear jewelry, make-up or nail polish.  Do not wear lotions, powders, or perfumes, or deoderant.  Do not shave 48 hours prior to surgery.   Do not bring valuables to the hospital.  [facility] is not responsible for any belongings or valuables.  Contacts, dentures or bridgework may not be worn into surgery.    For patients admitted to the hospital, discharge time will be determined by your treatment team.  Patients discharged the day of surgery will not be allowed to drive home.   Special instructions:    Please read over the following fact sheets that you were given:       [facility] - Preparing for Surgery  Before surgery, you can play an important role.   Because skin is not sterile, your skin needs to be as free of germs as possible.   You can reduce the number of germs on your skin by washing with CHG (chlorahexidine gluconate) soap before surgery.   CHG is an antiseptic cleaner which kills germs and bonds with the skin to continue killing germs even after washing. Please DO NOT use if you have an allergy to CHG or antibacterial soaps .  If your skin becomes reddened/irritated stop using the CHG and inform your nurse when you arrive at Short Stay. Do not shave (including legs and underarms) for at least 48 hours prior to the first CHG shower.    Please follow these instructions carefully:  1.  Shower with CHG Soap the night before surgery and the  morning of Surgery.  2.  If you choose to wash your hair, wash your hair first as usual with  your  normal  shampoo.  3.  After you shampoo, rinse your hair and body thoroughly to remove the  shampoo.                                        4.  Use CHG as you would any other liquid soap.  You can apply chg directly  to the skin and wash                       Gently with a scrungie or clean washcloth.  5.  Apply the CHG Soap to your body ONLY FROM THE NECK DOWN.   Do not use on face/ open                           Wound or open sores. Avoid contact with eyes, ears mouth and genitals (private parts).                       Wash face,  Genitals (private parts) with your normal soap.             6.  Wash thoroughly,  paying special attention to the area where your surgery  will be performed.  7.  Thoroughly rinse your body with warm water from the neck down.  8.  DO NOT shower/wash with your normal soap after using and rinsing off  the CHG Soap.             9.  Pat yourself dry with a clean towel.            10.  Wear clean pajamas.            11.  Place clean sheets on your bed the night of your first shower and do not  sleep with pets. Day of Surgery : Do not apply any lotions/deodorants the morning of surgery.  Please wear clean clothes to the hospital/surgery center.  FAILURE TO FOLLOW THESE INSTRUCTIONS MAY RESULT IN THE CANCELLATION OF YOUR SURGERY PATIENT SIGNATURE_________________________________  NURSE SIGNATURE__________________________________  ________________________________________________________________________

## 2019-11-28 NOTE — Progress Notes (Signed)
PCP - Dr. Larose Kells Cardiologist - none  Chest x-ray - no EKG - no Stress Test - no ECHO - no Cardiac Cath - no  Sleep Study - no CPAP -   Fasting Blood Sugar - NA Checks Blood Sugar _____ times a day  Blood Thinner Instructions:NA Aspirin Instructions: Last Dose:  Anesthesia review:   Patient denies shortness of breath, fever, cough and chest pain at PAT appointment yes  Patient verbalized understanding of instructions that were given to them at the PAT appointment. Patient was also instructed that they will need to review over the PAT instructions again at home before surgery. yes

## 2019-11-29 ENCOUNTER — Other Ambulatory Visit: Payer: Self-pay

## 2019-11-29 ENCOUNTER — Encounter (HOSPITAL_BASED_OUTPATIENT_CLINIC_OR_DEPARTMENT_OTHER)
Admission: RE | Disposition: A | Discharge status: Home / Self Care | Payer: Self-pay | Source: Home / Self Care | Attending: Obstetrics and Gynecology

## 2019-11-29 ENCOUNTER — Encounter (HOSPITAL_BASED_OUTPATIENT_CLINIC_OR_DEPARTMENT_OTHER): Payer: Self-pay | Admitting: Obstetrics and Gynecology

## 2019-11-29 ENCOUNTER — Ambulatory Visit (HOSPITAL_BASED_OUTPATIENT_CLINIC_OR_DEPARTMENT_OTHER)
Admission: RE | Admit: 2019-11-29 | Discharge: 2019-11-30 | Disposition: A | Discharge status: Home / Self Care | Payer: Managed Care, Other (non HMO) | Attending: Obstetrics and Gynecology | Admitting: Obstetrics and Gynecology

## 2019-11-29 ENCOUNTER — Ambulatory Visit (HOSPITAL_BASED_OUTPATIENT_CLINIC_OR_DEPARTMENT_OTHER): Payer: Managed Care, Other (non HMO) | Admitting: Anesthesiology

## 2019-11-29 DIAGNOSIS — N72 Inflammatory disease of cervix uteri: Secondary | ICD-10-CM | POA: Insufficient documentation

## 2019-11-29 DIAGNOSIS — R87810 Cervical high risk human papillomavirus (HPV) DNA test positive: Secondary | ICD-10-CM | POA: Insufficient documentation

## 2019-11-29 DIAGNOSIS — Z87891 Personal history of nicotine dependence: Secondary | ICD-10-CM | POA: Insufficient documentation

## 2019-11-29 DIAGNOSIS — Z79899 Other long term (current) drug therapy: Secondary | ICD-10-CM | POA: Diagnosis not present

## 2019-11-29 DIAGNOSIS — N952 Postmenopausal atrophic vaginitis: Secondary | ICD-10-CM | POA: Diagnosis not present

## 2019-11-29 DIAGNOSIS — Z90710 Acquired absence of both cervix and uterus: Secondary | ICD-10-CM | POA: Diagnosis present

## 2019-11-29 DIAGNOSIS — R87619 Unspecified abnormal cytological findings in specimens from cervix uteri: Secondary | ICD-10-CM | POA: Diagnosis present

## 2019-11-29 HISTORY — PX: ROBOTIC ASSISTED LAPAROSCOPIC HYSTERECTOMY AND SALPINGECTOMY: SHX6379

## 2019-11-29 LAB — TYPE AND SCREEN
ABO/RH(D): O POS
Antibody Screen: NEGATIVE

## 2019-11-29 LAB — POCT PREGNANCY, URINE: Preg Test, Ur: NEGATIVE

## 2019-11-29 SURGERY — XI ROBOTIC ASSISTED LAPAROSCOPIC HYSTERECTOMY AND SALPINGECTOMY
Anesthesia: General | Site: Abdomen | Laterality: Bilateral

## 2019-11-29 MED ORDER — LACTATED RINGERS IV SOLN
INTRAVENOUS | Status: DC
  Filled 2019-11-29 (×2): qty 1000

## 2019-11-29 MED ORDER — MENTHOL 3 MG MT LOZG
1.0000 | LOZENGE | OROMUCOSAL | Status: DC | PRN
  Filled 2019-11-29: qty 9

## 2019-11-29 MED ORDER — DEXTROSE IN LACTATED RINGERS 5 % IV SOLN
INTRAVENOUS | Status: DC
  Filled 2019-11-29 (×2): qty 1000

## 2019-11-29 MED ORDER — ONDANSETRON HCL 4 MG/2ML IJ SOLN
INTRAMUSCULAR | Status: AC
  Filled 2019-11-29: qty 2

## 2019-11-29 MED ORDER — LIDOCAINE 2% (20 MG/ML) 5 ML SYRINGE
INTRAMUSCULAR | Status: AC
  Filled 2019-11-29: qty 15

## 2019-11-29 MED ORDER — LIDOCAINE 2% (20 MG/ML) 5 ML SYRINGE
INTRAMUSCULAR | Status: DC | PRN
  Administered 2019-11-29: 1.5 mg/kg/h via INTRAVENOUS

## 2019-11-29 MED ORDER — ACETAMINOPHEN 10 MG/ML IV SOLN
1000.0000 mg | Freq: Once | INTRAVENOUS | Status: DC
  Filled 2019-11-29: qty 100

## 2019-11-29 MED ORDER — SCOPOLAMINE 1 MG/3DAYS TD PT72
MEDICATED_PATCH | TRANSDERMAL | Status: DC | PRN
  Administered 2019-11-29: 1 via TRANSDERMAL

## 2019-11-29 MED ORDER — PANTOPRAZOLE SODIUM 40 MG PO TBEC
DELAYED_RELEASE_TABLET | ORAL | Status: AC
  Filled 2019-11-29: qty 1

## 2019-11-29 MED ORDER — GLYCOPYRROLATE 0.2 MG/ML IJ SOLN
INTRAMUSCULAR | Status: DC | PRN
  Administered 2019-11-29 (×2): .1 mg via INTRAVENOUS

## 2019-11-29 MED ORDER — ROCURONIUM BROMIDE 10 MG/ML (PF) SYRINGE
PREFILLED_SYRINGE | INTRAVENOUS | Status: AC
  Filled 2019-11-29: qty 10

## 2019-11-29 MED ORDER — HYDROMORPHONE HCL 1 MG/ML IJ SOLN
0.2500 mg | INTRAMUSCULAR | Status: DC | PRN
  Administered 2019-11-29: 0.5 mg via INTRAVENOUS
  Filled 2019-11-29: qty 0.5

## 2019-11-29 MED ORDER — OXYCODONE HCL 5 MG PO TABS
5.0000 mg | ORAL_TABLET | ORAL | Status: DC | PRN
  Administered 2019-11-30 (×2): 5 mg via ORAL
  Filled 2019-11-29: qty 2

## 2019-11-29 MED ORDER — EPHEDRINE SULFATE 50 MG/ML IJ SOLN
INTRAMUSCULAR | Status: DC | PRN
  Administered 2019-11-29 (×2): 10 mg via INTRAVENOUS

## 2019-11-29 MED ORDER — PANTOPRAZOLE SODIUM 40 MG PO TBEC
40.0000 mg | DELAYED_RELEASE_TABLET | Freq: Every day | ORAL | Status: DC
  Administered 2019-11-29: 40 mg via ORAL
  Filled 2019-11-29: qty 1

## 2019-11-29 MED ORDER — GLYCOPYRROLATE PF 0.2 MG/ML IJ SOSY
PREFILLED_SYRINGE | INTRAMUSCULAR | Status: AC
  Filled 2019-11-29: qty 1

## 2019-11-29 MED ORDER — PROPOFOL 10 MG/ML IV BOLUS
INTRAVENOUS | Status: AC
  Filled 2019-11-29: qty 20

## 2019-11-29 MED ORDER — ROCURONIUM BROMIDE 100 MG/10ML IV SOLN
INTRAVENOUS | Status: DC | PRN
  Administered 2019-11-29: 20 mg via INTRAVENOUS
  Administered 2019-11-29: 10 mg via INTRAVENOUS
  Administered 2019-11-29: 50 mg via INTRAVENOUS

## 2019-11-29 MED ORDER — HYDROMORPHONE HCL 1 MG/ML IJ SOLN
INTRAMUSCULAR | Status: AC
  Filled 2019-11-29: qty 1

## 2019-11-29 MED ORDER — PROMETHAZINE HCL 25 MG/ML IJ SOLN
6.2500 mg | INTRAMUSCULAR | Status: DC | PRN
  Filled 2019-11-29: qty 1

## 2019-11-29 MED ORDER — SCOPOLAMINE 1 MG/3DAYS TD PT72
1.0000 | MEDICATED_PATCH | TRANSDERMAL | Status: DC
  Administered 2019-11-29: 1.5 mg via TRANSDERMAL
  Filled 2019-11-29: qty 1

## 2019-11-29 MED ORDER — PROMETHAZINE HCL 25 MG/ML IJ SOLN
6.2500 mg | Freq: Once | INTRAMUSCULAR | Status: DC
  Filled 2019-11-29: qty 1

## 2019-11-29 MED ORDER — OXYCODONE HCL 5 MG PO TABS
5.0000 mg | ORAL_TABLET | Freq: Once | ORAL | Status: DC | PRN
  Filled 2019-11-29: qty 1

## 2019-11-29 MED ORDER — SUGAMMADEX SODIUM 200 MG/2ML IV SOLN
INTRAVENOUS | Status: DC | PRN
  Administered 2019-11-29: 200 mg via INTRAVENOUS

## 2019-11-29 MED ORDER — KETOROLAC TROMETHAMINE 30 MG/ML IJ SOLN
30.0000 mg | Freq: Four times a day (QID) | INTRAMUSCULAR | Status: DC
  Administered 2019-11-29 – 2019-11-30 (×2): 30 mg via INTRAVENOUS
  Filled 2019-11-29: qty 1

## 2019-11-29 MED ORDER — FENTANYL CITRATE (PF) 100 MCG/2ML IJ SOLN
INTRAMUSCULAR | Status: DC | PRN
  Administered 2019-11-29 (×5): 50 ug via INTRAVENOUS

## 2019-11-29 MED ORDER — DEXAMETHASONE SODIUM PHOSPHATE 4 MG/ML IJ SOLN
INTRAMUSCULAR | Status: DC | PRN
  Administered 2019-11-29: 10 mg via INTRAVENOUS

## 2019-11-29 MED ORDER — DEXAMETHASONE SODIUM PHOSPHATE 10 MG/ML IJ SOLN
INTRAMUSCULAR | Status: AC
  Filled 2019-11-29: qty 1

## 2019-11-29 MED ORDER — ONDANSETRON HCL 4 MG PO TABS
4.0000 mg | ORAL_TABLET | Freq: Four times a day (QID) | ORAL | Status: DC | PRN
  Filled 2019-11-29: qty 1

## 2019-11-29 MED ORDER — SIMETHICONE 80 MG PO CHEW
80.0000 mg | CHEWABLE_TABLET | Freq: Four times a day (QID) | ORAL | Status: DC | PRN
  Filled 2019-11-29: qty 1

## 2019-11-29 MED ORDER — MIDAZOLAM HCL 5 MG/5ML IJ SOLN
INTRAMUSCULAR | Status: DC | PRN
  Administered 2019-11-29: 2 mg via INTRAVENOUS

## 2019-11-29 MED ORDER — BUPIVACAINE HCL (PF) 0.25 % IJ SOLN
INTRAMUSCULAR | Status: DC | PRN
  Administered 2019-11-29: 6 mL

## 2019-11-29 MED ORDER — MIDAZOLAM HCL 2 MG/2ML IJ SOLN
INTRAMUSCULAR | Status: AC
  Filled 2019-11-29: qty 2

## 2019-11-29 MED ORDER — CEFAZOLIN SODIUM-DEXTROSE 2-4 GM/100ML-% IV SOLN
2.0000 g | INTRAVENOUS | Status: AC
  Administered 2019-11-29: 2 g via INTRAVENOUS
  Filled 2019-11-29: qty 100

## 2019-11-29 MED ORDER — SCOPOLAMINE 1 MG/3DAYS TD PT72
MEDICATED_PATCH | TRANSDERMAL | Status: AC
  Filled 2019-11-29: qty 1

## 2019-11-29 MED ORDER — KETOROLAC TROMETHAMINE 30 MG/ML IJ SOLN
INTRAMUSCULAR | Status: DC | PRN
  Administered 2019-11-29: 30 mg via INTRAVENOUS

## 2019-11-29 MED ORDER — IBUPROFEN 800 MG PO TABS
800.0000 mg | ORAL_TABLET | Freq: Four times a day (QID) | ORAL | Status: DC
  Filled 2019-11-29: qty 1

## 2019-11-29 MED ORDER — ONDANSETRON HCL 4 MG/2ML IJ SOLN
4.0000 mg | Freq: Four times a day (QID) | INTRAMUSCULAR | Status: DC | PRN
  Administered 2019-11-29: 4 mg via INTRAVENOUS
  Filled 2019-11-29: qty 2

## 2019-11-29 MED ORDER — LIDOCAINE HCL (CARDIAC) PF 100 MG/5ML IV SOSY
PREFILLED_SYRINGE | INTRAVENOUS | Status: DC | PRN
  Administered 2019-11-29: 60 mg via INTRAVENOUS

## 2019-11-29 MED ORDER — PROPOFOL 10 MG/ML IV BOLUS
INTRAVENOUS | Status: DC | PRN
  Administered 2019-11-29: 150 mg via INTRAVENOUS

## 2019-11-29 MED ORDER — LIDOCAINE 2% (20 MG/ML) 5 ML SYRINGE
INTRAMUSCULAR | Status: AC
  Filled 2019-11-29: qty 5

## 2019-11-29 MED ORDER — CEFAZOLIN SODIUM-DEXTROSE 2-4 GM/100ML-% IV SOLN
INTRAVENOUS | Status: AC
  Filled 2019-11-29: qty 100

## 2019-11-29 MED ORDER — FENTANYL CITRATE (PF) 250 MCG/5ML IJ SOLN
INTRAMUSCULAR | Status: AC
  Filled 2019-11-29: qty 5

## 2019-11-29 MED ORDER — DEXAMETHASONE SODIUM PHOSPHATE 10 MG/ML IJ SOLN
10.0000 mg | Freq: Once | INTRAMUSCULAR | Status: AC
  Administered 2019-11-29: 10 mg via INTRAVENOUS
  Filled 2019-11-29: qty 1

## 2019-11-29 MED ORDER — OXYCODONE HCL 5 MG/5ML PO SOLN
5.0000 mg | Freq: Once | ORAL | Status: DC | PRN
  Filled 2019-11-29: qty 5

## 2019-11-29 MED ORDER — SODIUM CHLORIDE 0.9 % IR SOLN
Status: DC | PRN
  Administered 2019-11-29: 100 mL

## 2019-11-29 MED ORDER — KETOROLAC TROMETHAMINE 30 MG/ML IJ SOLN
INTRAMUSCULAR | Status: AC
  Filled 2019-11-29: qty 1

## 2019-11-29 MED ORDER — ONDANSETRON HCL 4 MG/2ML IJ SOLN
INTRAMUSCULAR | Status: DC | PRN
  Administered 2019-11-29: 4 mg via INTRAVENOUS

## 2019-11-29 MED ORDER — EPHEDRINE 5 MG/ML INJ
INTRAVENOUS | Status: AC
  Filled 2019-11-29: qty 10

## 2019-11-29 MED ORDER — KETOROLAC TROMETHAMINE 30 MG/ML IJ SOLN
30.0000 mg | Freq: Once | INTRAMUSCULAR | Status: DC | PRN
  Filled 2019-11-29: qty 1

## 2019-11-29 SURGICAL SUPPLY — 62 items
ADH SKN CLS APL DERMABOND .7 (GAUZE/BANDAGES/DRESSINGS) ×1
APL SRG 38 LTWT LNG FL B (MISCELLANEOUS) ×1
APPLICATOR ARISTA FLEXITIP XL (MISCELLANEOUS) ×2 IMPLANT
BARRIER ADHS 3X4 INTERCEED (GAUZE/BANDAGES/DRESSINGS) IMPLANT
BRR ADH 4X3 ABS CNTRL BYND (GAUZE/BANDAGES/DRESSINGS)
CANISTER SUCT 3000ML PPV (MISCELLANEOUS) ×3 IMPLANT
CATH FOLEY 3WAY  5CC 16FR (CATHETERS) ×2
CATH FOLEY 3WAY 5CC 16FR (CATHETERS) ×1 IMPLANT
COVER BACK TABLE 60X90IN (DRAPES) ×3 IMPLANT
COVER TIP SHEARS 8 DVNC (MISCELLANEOUS) ×1 IMPLANT
COVER TIP SHEARS 8MM DA VINCI (MISCELLANEOUS) ×2
DECANTER SPIKE VIAL GLASS SM (MISCELLANEOUS) ×1 IMPLANT
DEFOGGER SCOPE WARMER CLEARIFY (MISCELLANEOUS) ×3 IMPLANT
DERMABOND ADVANCED (GAUZE/BANDAGES/DRESSINGS) ×2
DERMABOND ADVANCED .7 DNX12 (GAUZE/BANDAGES/DRESSINGS) ×1 IMPLANT
DRAPE ARM DVNC X/XI (DISPOSABLE) ×4 IMPLANT
DRAPE COLUMN DVNC XI (DISPOSABLE) ×1 IMPLANT
DRAPE DA VINCI XI ARM (DISPOSABLE) ×8
DRAPE DA VINCI XI COLUMN (DISPOSABLE) ×2
DURAPREP 26ML APPLICATOR (WOUND CARE) ×3 IMPLANT
ELECT REM PT RETURN 9FT ADLT (ELECTROSURGICAL) ×3
ELECTRODE REM PT RTRN 9FT ADLT (ELECTROSURGICAL) ×1 IMPLANT
GLOVE BIOGEL PI IND STRL 7.0 (GLOVE) ×5 IMPLANT
GLOVE BIOGEL PI INDICATOR 7.0 (GLOVE) ×10
GLOVE ECLIPSE 6.5 STRL STRAW (GLOVE) ×9 IMPLANT
HEMOSTAT ARISTA ABSORB 3G PWDR (HEMOSTASIS) ×2 IMPLANT
IRRIG SUCT STRYKERFLOW 2 WTIP (MISCELLANEOUS) ×3
IRRIGATION SUCT STRKRFLW 2 WTP (MISCELLANEOUS) ×1 IMPLANT
LEGGING LITHOTOMY PAIR STRL (DRAPES) ×3 IMPLANT
MANIPULATOR ADVINCU DEL 2.5 PL (MISCELLANEOUS) ×2 IMPLANT
MANIPULATOR ADVINCU DEL 3.0 PL (MISCELLANEOUS) ×2 IMPLANT
MANIPULATOR UTERINE 4.5 ZUMI (MISCELLANEOUS) ×2 IMPLANT
NEEDLE INSUFFLATION 120MM (ENDOMECHANICALS) ×3 IMPLANT
OBTURATOR OPTICAL STANDARD 8MM (TROCAR) ×2
OBTURATOR OPTICAL STND 8 DVNC (TROCAR) ×1
OBTURATOR OPTICALSTD 8 DVNC (TROCAR) ×1 IMPLANT
OCCLUDER COLPOPNEUMO (BALLOONS) ×3 IMPLANT
PACK ROBOT WH (CUSTOM PROCEDURE TRAY) ×3 IMPLANT
PACK ROBOTIC GOWN (GOWN DISPOSABLE) ×3 IMPLANT
PACK TRENDGUARD 450 HYBRID PRO (MISCELLANEOUS) ×1 IMPLANT
PAD PREP 24X48 CUFFED NSTRL (MISCELLANEOUS) ×3 IMPLANT
PROTECTOR NERVE ULNAR (MISCELLANEOUS) ×6 IMPLANT
SEAL CANN UNIV 5-8 DVNC XI (MISCELLANEOUS) ×4 IMPLANT
SEAL XI 5MM-8MM UNIVERSAL (MISCELLANEOUS) ×8
SEALER VESSEL DA VINCI XI (MISCELLANEOUS) ×2
SEALER VESSEL EXT DVNC XI (MISCELLANEOUS) ×1 IMPLANT
SET IRRIG Y TYPE TUR BLADDER L (SET/KITS/TRAYS/PACK) IMPLANT
SET TRI-LUMEN FLTR TB AIRSEAL (TUBING) ×3 IMPLANT
SUT VIC AB 0 CT1 36 (SUTURE) ×6 IMPLANT
SUT VICRYL 4-0 PS2 18IN ABS (SUTURE) ×6 IMPLANT
SUT VLOC 180 0 6IN GS21 (SUTURE) ×2 IMPLANT
SUT VLOC 180 0 9IN  GS21 (SUTURE) ×2
SUT VLOC 180 0 9IN GS21 (SUTURE) ×1 IMPLANT
TIP RUMI ORANGE 6.7MMX12CM (TIP) IMPLANT
TIP UTERINE 5.1X6CM LAV DISP (MISCELLANEOUS) ×2 IMPLANT
TIP UTERINE 6.7X10CM GRN DISP (MISCELLANEOUS) IMPLANT
TIP UTERINE 6.7X6CM WHT DISP (MISCELLANEOUS) IMPLANT
TIP UTERINE 6.7X8CM BLUE DISP (MISCELLANEOUS) IMPLANT
TOWEL OR 17X26 10 PK STRL BLUE (TOWEL DISPOSABLE) ×6 IMPLANT
TRENDGUARD 450 HYBRID PRO PACK (MISCELLANEOUS) ×3
TROCAR PORT AIRSEAL 8X120 (TROCAR) ×3 IMPLANT
WATER STERILE IRR 1000ML POUR (IV SOLUTION) ×1 IMPLANT

## 2019-11-29 NOTE — Progress Notes (Signed)
Patient continues to c/o persistent nausea despite prn zofran IV given at 1830. Patient also c/o "lightheadnesses and dizziness". Patient vomited 50 ml green/bile emesis.  VSS. Dr. Garwin Brothers notified, advised to contact anesthesiology for orders. Dr. Ambrose Pancoast, anesthesiologist notified, orders given for IV decadron x 1 and if no improvement IV phenergan x 1. Order also given to D/C scopolamine patch. Informed Dr. Garwin Brothers of orders, Dr. Garwin Brothers in agreement. Orders enacted, will continue to monitor.

## 2019-11-29 NOTE — Anesthesia Procedure Notes (Signed)
Procedure Name: Intubation Date/Time: 11/29/2019 1:04 PM Performed by: Justice Rocher, CRNA Pre-anesthesia Checklist: Patient identified, Emergency Drugs available, Suction available and Patient being monitored Patient Re-evaluated:Patient Re-evaluated prior to induction Oxygen Delivery Method: Circle system utilized Preoxygenation: Pre-oxygenation with 100% oxygen Induction Type: IV induction Ventilation: Mask ventilation without difficulty Laryngoscope Size: Mac and 3 Grade View: Grade II Tube type: Oral Tube size: 7.0 mm Number of attempts: 1 Airway Equipment and Method: Stylet and Oral airway Placement Confirmation: ETT inserted through vocal cords under direct vision,  positive ETCO2 and breath sounds checked- equal and bilateral Secured at: 23 cm Tube secured with: Tape Dental Injury: Teeth and Oropharynx as per pre-operative assessment

## 2019-11-29 NOTE — Brief Op Note (Signed)
11/29/2019  3:46 PM  PATIENT:  Heather Collins  59 y.o. female  PRE-OPERATIVE DIAGNOSIS:  Persistent Abnormal Papsmear, Positive Cervical Human Papilloma Virus  POST-OPERATIVE DIAGNOSIS:  Persistent Abnormal Papsmear, Positive Cervical Human   PROCEDURE:  Da Vinci robotic total hysterectomy, bilateral salpingectomy  SURGEON:  Surgeon(s) and Role:    * Servando Salina, MD - Primary  PHYSICIAN ASSISTANT:   ASSISTANTS: Derrell Lolling, CNM   ANESTHESIA:   general FINDINGS: atrophic ovaries, elongated tubes, small uterus, nl liver edge. Large GB, nl ureters EBL:  15 mL   BLOOD ADMINISTERED:none  DRAINS: none   LOCAL MEDICATIONS USED:  MARCAINE     SPECIMEN:  Source of Specimen:  uterus with cervix, tubes  DISPOSITION OF SPECIMEN:  PATHOLOGY  COUNTS:  YES  TOURNIQUET:  * No tourniquets in log *  DICTATION: .Other Dictation: Dictation Number [id number]  PLAN OF CARE: Admit for overnight observation  PATIENT DISPOSITION:  PACU - hemodynamically stable.   Delay start of Pharmacological VTE agent (>24hrs) due to surgical blood loss or risk of bleeding: no

## 2019-11-29 NOTE — Transfer of Care (Signed)
Immediate Anesthesia Transfer of Care Note  Patient: Heather Collins  Procedure(s) Performed: Procedure(s) (LRB): XI ROBOTIC ASSISTED LAPAROSCOPIC HYSTERECTOMY AND SALPINGECTOMY (Bilateral)  Patient Location: PACU  Anesthesia Type: General  Level of Consciousness: awake, sedated, patient cooperative and responds to stimulation  Airway & Oxygen Therapy: Patient Spontanous Breathing and Patient connected to face [place] 02 and soft FM   Post-op Assessment: Report given to PACU RN, Post -op Vital signs reviewed and stable and Patient moving all extremities  Post vital signs: Reviewed and stable  Complications: No apparent anesthesia complications

## 2019-11-29 NOTE — Anesthesia Preprocedure Evaluation (Signed)
Anesthesia Evaluation  Patient identified by MRN, date of birth, ID band Patient awake    Reviewed: Allergy & Precautions, NPO status , Patient's Chart, lab work & pertinent test results  Airway Mallampati: II  TM Distance: >3 FB Neck ROM: Full    Dental no notable dental hx.    Pulmonary neg pulmonary ROS, former smoker,    Pulmonary exam normal breath sounds clear to auscultation       Cardiovascular negative cardio ROS Normal cardiovascular exam Rhythm:Regular Rate:Normal     Neuro/Psych negative neurological ROS  negative psych ROS   GI/Hepatic Neg liver ROS, hiatal hernia, GERD  ,  Endo/Other  negative endocrine ROS  Renal/GU negative Renal ROS  negative genitourinary   Musculoskeletal negative musculoskeletal ROS (+)   Abdominal   Peds negative pediatric ROS (+)  Hematology negative hematology ROS (+)   Anesthesia Other Findings   Reproductive/Obstetrics negative OB ROS                             Anesthesia Physical Anesthesia Plan  ASA: II  Anesthesia Plan: General   Post-op Pain Management:    Induction: Intravenous and Rapid sequence  PONV Risk Score and Plan: 4 or greater and Ondansetron, Dexamethasone, Treatment may vary due to age or medical condition, Midazolam and Scopolamine patch - Pre-op  Airway Management Planned: Oral ETT  Additional Equipment:   Intra-op Plan:   Post-operative Plan: Extubation in OR  Informed Consent: I have reviewed the patients History and Physical, chart, labs and discussed the procedure including the risks, benefits and alternatives for the proposed anesthesia with the patient or authorized representative who has indicated his/her understanding and acceptance.     Dental advisory given  Plan Discussed with: CRNA and Surgeon  Anesthesia Plan Comments:         Anesthesia Quick Evaluation

## 2019-11-29 NOTE — Anesthesia Postprocedure Evaluation (Signed)
Anesthesia Post Note  Patient: Heather Collins  Procedure(s) Performed: XI ROBOTIC ASSISTED LAPAROSCOPIC HYSTERECTOMY AND SALPINGECTOMY (Bilateral Abdomen)     Patient location during evaluation: PACU Anesthesia Type: General Level of consciousness: awake and alert Pain management: pain level controlled Vital Signs Assessment: post-procedure vital signs reviewed and stable Respiratory status: spontaneous breathing, nonlabored ventilation, respiratory function stable and patient connected to nasal cannula oxygen Cardiovascular status: blood pressure returned to baseline and stable Postop Assessment: no apparent nausea or vomiting Anesthetic complications: no    Last Vitals:  Vitals:   11/29/19 1538 11/29/19 1545  BP: (!) 105/58 (!) 103/55  Pulse: 73 68  Resp: 15 19  Temp: (!) 36.3 C   SpO2: 100% 98%    Last Pain:  Vitals:   11/29/19 1538  TempSrc:   PainSc: 0-No pain                 Anedra Penafiel

## 2019-11-30 DIAGNOSIS — R87810 Cervical high risk human papillomavirus (HPV) DNA test positive: Secondary | ICD-10-CM | POA: Diagnosis not present

## 2019-11-30 LAB — CBC
HCT: 34.9 % — ABNORMAL LOW (ref 36.0–46.0)
Hemoglobin: 11.9 g/dL — ABNORMAL LOW (ref 12.0–15.0)
MCH: 32.1 pg (ref 26.0–34.0)
MCHC: 34.1 g/dL (ref 30.0–36.0)
MCV: 94.1 fL (ref 80.0–100.0)
Platelets: 200 10*3/uL (ref 150–400)
RBC: 3.71 MIL/uL — ABNORMAL LOW (ref 3.87–5.11)
RDW: 12.2 % (ref 11.5–15.5)
WBC: 8.3 10*3/uL (ref 4.0–10.5)
nRBC: 0 % (ref 0.0–0.2)

## 2019-11-30 LAB — BASIC METABOLIC PANEL
Anion gap: 7 (ref 5–15)
BUN: 11 mg/dL (ref 6–20)
CO2: 26 mmol/L (ref 22–32)
Calcium: 8.9 mg/dL (ref 8.9–10.3)
Chloride: 103 mmol/L (ref 98–111)
Creatinine, Ser: 0.68 mg/dL (ref 0.44–1.00)
GFR calc Af Amer: >60 mL/min (ref >60)
GFR calc non Af Amer: >60 mL/min (ref >60)
Glucose, Bld: 174 mg/dL — ABNORMAL HIGH (ref 70–99)
Potassium: 3.9 mmol/L (ref 3.5–5.1)
Sodium: 136 mmol/L (ref 135–145)

## 2019-11-30 MED ORDER — OXYCODONE HCL 5 MG PO TABS
ORAL_TABLET | ORAL | Status: AC
  Filled 2019-11-30: qty 1

## 2019-11-30 MED ORDER — OXYCODONE HCL 5 MG PO TABS: 5.0000 mg | ORAL_TABLET | ORAL | 0 refills | Status: AC | PRN

## 2019-11-30 MED ORDER — KETOROLAC TROMETHAMINE 30 MG/ML IJ SOLN
INTRAMUSCULAR | Status: AC
  Filled 2019-11-30: qty 1

## 2019-11-30 MED ORDER — IBUPROFEN 800 MG PO TABS: 800.0000 mg | ORAL_TABLET | Freq: Four times a day (QID) | ORAL | 6 refills | Status: DC | PRN

## 2019-11-30 MED ORDER — BUTALBITAL-APAP-CAFFEINE 50-325-40 MG PO TABS
1.0000 | ORAL_TABLET | Freq: Four times a day (QID) | ORAL | Status: DC | PRN
  Filled 2019-11-30: qty 1

## 2019-11-30 NOTE — Progress Notes (Signed)
DR Garwin Brothers called for update on pt.  Pt ambulated in hall and foley was removed.  Nausea is better. Pt c/o headache and orders received

## 2019-11-30 NOTE — Progress Notes (Signed)
Dr. Garwin Brothers called unit on her way to see pt

## 2019-11-30 NOTE — Progress Notes (Signed)
Subjective: Patient reports tolerating PO and no problems voiding.   Reviewed intraoperative findings  Objective: I have reviewed patient's vital signs.  vital signs, intake and output, and labs. Vitals:   11/30/19 0217 11/30/19 0610  BP: 107/60 (!) 95/56  Pulse: 66 61  Resp: 16 16  Temp: 97.9 F (36.6 C) 98.1 F (36.7 C)  SpO2: 97% 99%   I/O last 3 completed shifts: In: 1893.8 [I.V.:1893.8] Out: 65 [Urine:650; Emesis/NG output:50; Blood:15] Total I/O In: -  Out: 300 [Urine:300]  Lab Results  Component Value Date   WBC 8.3 11/30/2019   HGB 11.9 (L) 11/30/2019   HCT 34.9 (L) 11/30/2019   MCV 94.1 11/30/2019   PLT 200 11/30/2019   Lab Results  Component Value Date   CREATININE 0.68 11/30/2019    EXAM General: alert, cooperative, and appears stated age Resp: clear to auscultation bilaterally Cardio: regular rate and rhythm, S1, S2 normal, no murmur, click, rub or gallop GI: soft, non-tender; bowel sounds normal; no masses,  no organomegaly and incision: clean, dry, and intact Extremities: extremities normal, atraumatic, no cyanosis or edema Vaginal Bleeding: none Back: (-) CVAT  Assessment: s/p Procedure(s): XI ROBOTIC ASSISTED LAPAROSCOPIC HYSTERECTOMY AND SALPINGECTOMY: stable and progressing well Atrophic vaginitis Plan: Encourage ambulation Discontinue IV fluids Discharge home D/c instructions reviewed.  Will start premarin vaginal cream 2x/ wk F/u 2 wk   LOS: 0 days    Marvene Staff, MD 11/30/2019 9:14 AM    11/30/2019, 9:14 AM

## 2019-11-30 NOTE — Op Note (Signed)
NAMEGRINDLE, CANTARERO MEDICAL RECORD [id number] ACCOUNT [id number] DATE OF BIRTH:[birth year] FACILITY: [facility] LOCATION: [facility] PHYSICIAN:Roosevelt Bisher A. 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  SURGEON:  Servando Salina, MD  ASSISTANTS:  Derrell Lolling, CNM  DESCRIPTION OF PROCEDURE:  Under adequate general anesthesia, the patient was placed in the dorsal lithotomy position.  She was positioned for robotic surgery.  She was sterilely prepped and draped in the usual fashion.  A 3-way Foley catheter was  sterilely placed.  A vaginal retractor was placed.  Sims retractor was placed.  Single tooth tenaculum was placed on the anterior lip of the cervix.  The cervix was serially dilated up to #19 [facility] dilator.  Uterus sounded to 6 cm.  Advincula uterine  manipulator on a small cup was placed.  The retractors were removed.  Attention turned to the abdomen.  Supraumbilical incision was made after 0.25% Marcaine was injected.  Veress needle was introduced.  Opening pressure of 3 was noted.  Three liters of  CO2 was insufflated.  Veress needle was then removed.  An 8 mm robotic port was placed under direct visualization.  The lighted video laparoscope was then inserted.  Inspection overall showed entry into the abdomen without incident.  Panoramic inspection, normal liver, full gallbladder was noted.  The patient was placed in deep Trendelenburg position.  Uterus was noted to be small, atrophic ovaries, very elongated fallopian tubes.  Additional port sites were placed, one on either side and the  intervening AirSeal was placed on the left.  When all trocars were then placed, the robot was docked.  In  arm #1 was the long bipolar forceps and in arm #3 was the monopolar scissors.  I then went to the surgical console.  Both ureters were then seen peristalsing.  Both fallopian tubes were subsequently grasped.  Starting on the left hand side, the mesosalpinx was serially clamped, cauterized and then cut and the fallopian tube removed.  The retroperitoneal space on the left was then opened.  The left  uteroovarian ligament was serially clamped, cauterized, and then cut.  Round ligaments were then serially clamped, cauterized, and cut.  The anterior leaf of the broad ligament was opened and carried over to the vesicouterine peritoneum, which was opened  transversely and the bladder was sharply dissected off the RUMI cup superiorly and displaced inferiorly.  The uterine vessels were then identified, serially clamped, cauterized, and cut.  Same procedure was performed on the contralateral side.   Subsequently, the cervicovaginal junction was opened after the vaginal insufflator was installed.  Circumferentially, the cervix was detached from its vagina and the specimen removed through the vagina.  The other instruments were then replaced by the long tip forceps and the large mega suture needle driver respectively.  The vaginal cuff was inspected.  The bladder was displaced further inferiorly off the vaginal cuff.  A small piece of the cervix was subsequently removed from the back of the vaginal cuff.  The vaginal cuff was then closed in 2 layers using 0 V-Loc suture.  Good hemostasis was noted throughout.  The abdomen was irrigated and suctioned of debris.  The pressure was decreased to 8.  The pedicles were inspected, cauterized any small bleeding site and Arista  potato starch was then placed overlying the vaginal cuff.  The robotic instruments were removed and the robot undocked.  I went back to the patient's bedside sterilely.  The robotic ports were removed and abdomen deflated. Air seal port removed.  After  hemostasis, incisions were closed with 4-0 Vicryl subcuticular closures. The supraumbilical port fascia closed with 0 Vicryl suture.   The vaginal cuff was visually and digitally inspected and no defects were noted or palpated.  SPECIMEN:  Uterus with cervix and fallopian tubes sent to pathology.  ESTIMATED BLOOD LOSS:  15 mL.  INTRAOPERATIVE FLUIDS:  900 mL.  URINE OUTPUT:  150 mL.  COUNTS:  Sponge and instrument counts x2 was correct.  COMPLICATIONS:  None.  DISPOSITION:  The patient tolerated the procedure well and was transferred to recovery room in stable condition.  CN/NUANCE  D:11/29/2019 T:11/30/2019 JOB:[id number]

## 2019-11-30 NOTE — Discharge Summary (Signed)
Physician Discharge Summary  Patient ID: Heather Collins MRN: [id number] DOB/AGE: 58-22-1962 58 y.o.  Admit date: 11/29/2019 Discharge date: 11/30/2019  Admission Diagnoses: persistent high risk HPV positive, abnormal Pap smear  Discharge Diagnoses: same, atrophic vaginitis Active Problems:   Abnormal Pap smear of cervix   Status post total hysterectomy   Discharged Condition: stable  Hospital Course: pt underwent da Vinci robotic total hysterectomy, bilateral salpingectomy. Had problems with migraine, dizziness , light headedness and nausea which were addressed and resolved.   Consults: None  Significant Diagnostic Studies: labs: CBC Latest Ref Rng & Units 11/30/2019 11/28/2019 06/25/2019  WBC 4.0 - 10.5 K/uL 8.3 4.2 3.5(L)  Hemoglobin 12.0 - 15.0 g/dL 11.9(L) 13.2 13.8  Hematocrit 36.0 - 46.0 % 34.9(L) 39.7 40.0  Platelets 150 - 400 K/uL 200 240 199.0   bc CMP Latest Ref Rng & Units 11/30/2019 11/28/2019 06/25/2019  Glucose 70 - 99 mg/dL 174(H) 98 85  BUN 6 - 20 mg/dL 11 11 15   Creatinine 0.44 - 1.00 mg/dL 0.68 0.68 0.77  Sodium 135 - 145 mmol/L 136 139 142  Potassium 3.5 - 5.1 mmol/L 3.9 4.2 4.2  Chloride 98 - 111 mmol/L 103 107 105  CO2 22 - 32 mmol/L 26 26 29   Calcium 8.9 - 10.3 mg/dL 8.9 9.2 9.5  Total Protein 6.0 - 8.3 g/dL - - 6.8  Total Bilirubin 0.2 - 1.2 mg/dL - - 0.7  Alkaline Phos 39 - 117 U/L - - 64  AST 0 - 37 U/L - - 19  ALT 0 - 35 U/L - - 18    Treatments: surgery: Da vinci robotic total hysterectomy, bilateral salpingectomy  Discharge Exam: Blood pressure (!) 95/56, pulse 61, temperature 98.1 F (36.7 C), resp. rate 16, height 5' 2.5" (1.588 m), weight 55.7 kg, SpO2 99 %. General appearance: alert, cooperative, and no distress Back: no tenderness to percussion or palpation Resp: clear to auscultation bilaterally Cardio: regular rate and rhythm, S1, S2 normal, no murmur, click, rub or gallop GI: soft, non-tender; bowel sounds normal; no masses,  no  organomegaly Pelvic: deferred Incision/Wound: d/c/i  Disposition: Discharge disposition: 01-Home or Self Care       Discharge Instructions     Call MD for:  persistant nausea and vomiting   Complete by: As directed    Call MD for:  severe uncontrolled pain   Complete by: As directed    Call MD for:  temperature >100.4   Complete by: As directed    Diet general   Complete by: As directed    Discharge instructions   Complete by: As directed    Call if temperature greater than equal to 100.4, nothing per vagina for 4-6 weeks or severe nausea vomiting, increased incisional pain , drainage or redness in the incision site, no straining with bowel movements, showers no bath   May walk up steps   Complete by: As directed       Allergies as of 11/30/2019   No Known Allergies      Medication List     STOP taking these medications    meloxicam 15 MG tablet Commonly known as: Mobic       TAKE these medications    cetirizine 10 MG tablet Commonly known as: ZYRTEC Take 10 mg by mouth daily.   cholecalciferol 1000 units tablet Commonly known as: VITAMIN D Take 1,000 Units by mouth daily.   FISH OIL PO Take 1 capsule by mouth daily.   GINGER PO Take  2 capsules by mouth daily.   ibuprofen 800 MG tablet Commonly known as: ADVIL Take 1 tablet (800 mg total) by mouth every 6 (six) hours as needed.   ICAPS AREDS FORMULA PO Take 1 capsule by mouth daily.   oxyCODONE 5 MG immediate release tablet Commonly known as: Oxy IR/ROXICODONE Take 1-2 tablets (5-10 mg total) by mouth every 4 (four) hours as needed for up to 7 days for moderate pain or severe pain.   polyethylene glycol 17 g packet Commonly known as: MIRALAX / GLYCOLAX Take 17 g by mouth daily.   TURMERIC PO Take 1 tablet by mouth daily.       Follow-up Information     Servando Salina, MD On 12/14/2019.   Specialty: Obstetrics and Gynecology Contact information: [address] [place]  [address] [phone]            Signed: Alanda Slim A Mechele Kittleson 11/30/2019, 9:16 AM

## 2019-12-03 LAB — SURGICAL PATHOLOGY

## 2019-12-14 ENCOUNTER — Other Ambulatory Visit: Payer: Self-pay | Admitting: Obstetrics and Gynecology

## 2019-12-14 DIAGNOSIS — Z1231 Encounter for screening mammogram for malignant neoplasm of breast: Secondary | ICD-10-CM

## 2020-01-14 ENCOUNTER — Ambulatory Visit
Admission: RE | Admit: 2020-01-14 | Discharge: 2020-01-14 | Disposition: A | Discharge status: Home / Self Care | Payer: Managed Care, Other (non HMO) | Source: Ambulatory Visit | Attending: Obstetrics and Gynecology | Admitting: Obstetrics and Gynecology

## 2020-01-14 ENCOUNTER — Other Ambulatory Visit: Payer: Self-pay

## 2020-01-14 DIAGNOSIS — Z1231 Encounter for screening mammogram for malignant neoplasm of breast: Secondary | ICD-10-CM

## 2020-02-15 NOTE — Progress Notes (Signed)
Office Visit Note  Patient: Heather Collins             Date of Birth: 02-08-1961           MRN: [id number]             PCP: Colon Branch, MD Referring: Colon Branch, MD Visit Date: 02/26/2020 Occupation: @GUAROCC @  Subjective:  Pain in hands.   History of Present Illness: Heather Collins is a 59 y.o. female with osteoarthritis.  She continues to have some pain and stiffness in her hands.  None of the other joints are painful.  She has not noticed much swelling.  She states she has been using some over-the-counter supplements.  She takes ibuprofen occasionally but it causes reflux symptoms.  She has been also having some discomfort in her feet.  She is interested in getting new tennis shoes.  Activities of Daily Living:  Patient reports morning stiffness for 0 minutes.   Patient Reports nocturnal pain.  Difficulty dressing/grooming: Denies Difficulty climbing stairs: Denies Difficulty getting out of chair: Denies Difficulty using hands for taps, buttons, cutlery, and/or writing: Denies  Review of Systems  Constitutional: Negative for fatigue.  HENT: Negative for mouth sores, mouth dryness and nose dryness.   Eyes: Negative for itching and dryness.  Respiratory: Negative for shortness of breath, wheezing and difficulty breathing.   Cardiovascular: Negative for chest pain and palpitations.  Gastrointestinal: Negative for blood in stool, constipation and diarrhea.  Endocrine: Negative for increased urination.  Genitourinary: Negative for difficulty urinating and painful urination.  Musculoskeletal: Positive for arthralgias and joint pain. Negative for joint swelling, myalgias, morning stiffness, muscle tenderness and myalgias.  Skin: Negative for rash, hair loss and redness.  Allergic/Immunologic: Negative for susceptible to infections.  Neurological: Negative for dizziness, numbness, headaches, memory loss and weakness.  Hematological: Negative for bruising/bleeding tendency.    Psychiatric/Behavioral: Negative for confusion and sleep disturbance.    PMFS History:  Patient Active Problem List   Diagnosis Date Noted  . Abnormal Pap smear of cervix 11/29/2019  . Status post total hysterectomy 11/29/2019  . Annual physical exam 05/18/2017  . PCP NOTES >>>>>>>>>>>>>> 05/18/2017  . Contact dermatitis due to poison ivy 07/07/2013  . Dizziness and giddiness 05/25/2013  . Dyslipidemia 04/11/2012  . Left upper quadrant pain 04/11/2012  . CARPAL TUNNEL SYNDROME 01/01/2011  . RESTLESS LEG SYNDROME 11/25/2007  . MIGRAINE HEADACHE 11/25/2007  . G E R D 05/31/2007    Past Medical History:  Diagnosis Date  . Carpal tunnel syndrome   . Esophageal reflux   . Hiatal hernia   . Migraine, unspecified, without mention of intractable migraine without mention of status migrainosus   . 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 Otosclerosis involving oval window, nonobliterative    Bilateral  . Restless legs syndrome (RLS)     Family History  Problem Relation Age of Onset  . Heart disease Mother 21       at age 26  . Arthritis Mother   . Lung cancer Father   . Coronary artery disease Paternal Uncle   . Diabetes Sister   . Healthy Brother   . Healthy Brother   . Healthy Son   . Colon cancer Neg Hx   . Breast cancer Neg Hx    Past Surgical History:  Procedure Laterality Date  . ROBOTIC ASSISTED LAPAROSCOPIC HYSTERECTOMY AND SALPINGECTOMY Bilateral 11/29/2019   Procedure:  XI ROBOTIC ASSISTED LAPAROSCOPIC HYSTERECTOMY AND SALPINGECTOMY;  Surgeon: Servando Salina, MD;  Location: [place];  Service: Gynecology;  Laterality: Bilateral;  . STAPEDECTOMY Left 2016   Dr. Cresenciano Lick, with a 4.0 mm titanium Quentin Cornwall prosthesis with a large well and narrow shaft over a vein graft  . STAPEDECTOMY Right 2008   Social History   Social History Narrative   Son,  1997, going to college    Immunization History  Administered Date(s) Administered  . DTaP 04/04/2013  . Influenza Whole 09/11/2012  . Influenza-Unspecified 08/08/2016, 08/03/2017, 08/07/2019  . Td 11/08/2013     Objective: Vital Signs: BP 94/75 (BP Location: Left Arm, Patient Position: Sitting, Cuff Size: Normal)   Pulse 67   Resp 12   Ht 5' 2.5" (1.588 m)   Wt 129 lb 6.4 oz (58.7 kg)   LMP 12/04/2017 (Approximate)   BMI 23.29 kg/m    Physical Exam Vitals and nursing note reviewed.  Constitutional:      Appearance: She is well-developed.  HENT:     Head: Normocephalic and atraumatic.  Eyes:     Conjunctiva/sclera: Conjunctivae normal.  Cardiovascular:     Rate and Rhythm: Normal rate and regular rhythm.     Heart sounds: Normal heart sounds.  Pulmonary:     Effort: Pulmonary effort is normal.     Breath sounds: Normal breath sounds.  Abdominal:     General: Bowel sounds are normal.     Palpations: Abdomen is soft.  Musculoskeletal:     Cervical back: Normal range of motion.  Lymphadenopathy:     Cervical: No cervical adenopathy.  Skin:    General: Skin is warm and dry.     Capillary Refill: Capillary refill takes less than 2 seconds.  Neurological:     Mental Status: She is alert and oriented to person, place, and time.  Psychiatric:        Behavior: Behavior normal.      Musculoskeletal Exam: C-spine thoracic and lumbar spine with good range of motion.  Shoulder joints, elbow joints, wrist joints with good range of motion.  She has DIP and PIP thickening bilaterally.  No synovitis was noted.  Hip joints, knee joints were in good range of motion.  She has DIP and PIP thickening in her feet.  MTP thickening was noted.  CDAI Exam: CDAI Score: -- Patient Global: --; Provider Global: -- Swollen: --; Tender: -- Joint Exam 02/26/2020   No joint exam has been documented for this visit   There is currently no information documented on the homunculus. Go to the Rheumatology activity and  complete the homunculus joint exam.  Investigation: No additional findings.  Imaging: No results found.  Recent Labs: Lab Results  Component Value Date   WBC 8.3 11/30/2019   HGB 11.9 (L) 11/30/2019   PLT 200 11/30/2019   NA 136 11/30/2019   K 3.9 11/30/2019   CL 103 11/30/2019   CO2 26 11/30/2019   GLUCOSE 174 (H) 11/30/2019   BUN 11 11/30/2019   CREATININE 0.68 11/30/2019   BILITOT 0.7 06/25/2019   ALKPHOS 64 06/25/2019   AST 19 06/25/2019   ALT 18 06/25/2019   PROT 6.8 06/25/2019   ALBUMIN 4.4 06/25/2019   CALCIUM 8.9 11/30/2019   GFRAA >60 11/30/2019    Speciality Comments: No specialty comments available.  Procedures:  No procedures performed Allergies: Patient has no known allergies.   Assessment / Plan:     Visit Diagnoses: Primary osteoarthritis of both  hands-DIP and PIP thickening was noted.  No synovitis was noted on my examination.  Joint protection muscle strengthening was emphasized.  She will continue the use of natural anti-inflammatories.  Topical anti-inflammatories was discussed.  Avoidance of NSAIDs was discussed.  Bilateral carpal tunnel syndrome-her symptoms have improved since she has been using carpal tunnel braces.  Primary osteoarthritis of both feet-proper fitting shoes with arch support were discussed.  History of gastroesophageal reflux (GERD)  Dyslipidemia  Hx of migraines  RLS (restless legs syndrome)  Orders: No orders of the defined types were placed in this encounter.  Meds ordered this encounter  Medications  . omega-3 acid ethyl esters (LOVAZA) 1 g capsule    Sig: Take 1 capsule (1 g total) by mouth 2 (two) times daily.    Dispense:  60 capsule    Refill:  5      Follow-Up Instructions: Return in about 1 year (around 02/25/2021) for Osteoarthritis.   Bo Merino, MD  Note - This record has been created using Editor, commissioning.  Chart creation errors have been sought, but may not always  have been located.  Such creation errors do not reflect on  the standard of medical care.

## 2020-02-26 ENCOUNTER — Other Ambulatory Visit: Payer: Self-pay

## 2020-02-26 ENCOUNTER — Encounter: Payer: Self-pay | Admitting: Rheumatology

## 2020-02-26 ENCOUNTER — Ambulatory Visit (INDEPENDENT_AMBULATORY_CARE_PROVIDER_SITE_OTHER): Payer: Managed Care, Other (non HMO) | Admitting: Rheumatology

## 2020-02-26 VITALS — BP 94/75 | HR 67 | Resp 12 | Ht 62.5 in | Wt 129.4 lb

## 2020-02-26 DIAGNOSIS — M19071 Primary osteoarthritis, right ankle and foot: Secondary | ICD-10-CM

## 2020-02-26 DIAGNOSIS — Z8719 Personal history of other diseases of the digestive system: Secondary | ICD-10-CM | POA: Diagnosis not present

## 2020-02-26 DIAGNOSIS — M19042 Primary osteoarthritis, left hand: Secondary | ICD-10-CM

## 2020-02-26 DIAGNOSIS — E785 Hyperlipidemia, unspecified: Secondary | ICD-10-CM

## 2020-02-26 DIAGNOSIS — G5603 Carpal tunnel syndrome, bilateral upper limbs: Secondary | ICD-10-CM

## 2020-02-26 DIAGNOSIS — M19041 Primary osteoarthritis, right hand: Secondary | ICD-10-CM

## 2020-02-26 DIAGNOSIS — Z8669 Personal history of other diseases of the nervous system and sense organs: Secondary | ICD-10-CM

## 2020-02-26 DIAGNOSIS — M19072 Primary osteoarthritis, left ankle and foot: Secondary | ICD-10-CM

## 2020-02-26 DIAGNOSIS — G2581 Restless legs syndrome: Secondary | ICD-10-CM

## 2020-02-26 MED ORDER — OMEGA-3-ACID ETHYL ESTERS 1 G PO CAPS: 1.0000 g | ORAL_CAPSULE | Freq: Two times a day (BID) | ORAL | 5 refills | Status: DC

## 2020-06-30 ENCOUNTER — Ambulatory Visit: Payer: Managed Care, Other (non HMO) | Admitting: Internal Medicine

## 2020-07-21 ENCOUNTER — Encounter: Payer: Self-pay | Admitting: Internal Medicine

## 2020-07-21 ENCOUNTER — Other Ambulatory Visit: Payer: Self-pay

## 2020-07-21 ENCOUNTER — Ambulatory Visit: Payer: Managed Care, Other (non HMO) | Admitting: Internal Medicine

## 2020-07-21 VITALS — BP 98/65 | HR 70 | Temp 98.0°F | Resp 16 | Ht 63.0 in | Wt 127.4 lb

## 2020-07-21 DIAGNOSIS — R739 Hyperglycemia, unspecified: Secondary | ICD-10-CM | POA: Diagnosis not present

## 2020-07-21 DIAGNOSIS — E785 Hyperlipidemia, unspecified: Secondary | ICD-10-CM

## 2020-07-21 DIAGNOSIS — D649 Anemia, unspecified: Secondary | ICD-10-CM | POA: Diagnosis not present

## 2020-07-21 NOTE — Patient Instructions (Signed)
° ° °  GO TO THE LAB : Get the blood work   ° ° °GO TO THE FRONT DESK, PLEASE SCHEDULE YOUR APPOINTMENTS °Come back for   a checkup in 1 year  ° °

## 2020-07-21 NOTE — Progress Notes (Signed)
Pre visit review using our clinic review tool, if applicable. No additional management support is needed unless otherwise documented below in the visit note. 

## 2020-07-21 NOTE — Progress Notes (Signed)
Subjective:    Patient ID: Heather Collins, female    DOB: 1961/05/01, 59 y.o.   MRN: [id number]  DOS:  07/21/2020 Type of visit - description: Routine checkup No major concerns. Feeling well. Mild anemia noted on chart review, low Hg was immediately after her hysterectomy.   Review of Systems Denies nausea, vomiting, diarrhea.   Past Medical History:  Diagnosis Date  . Carpal tunnel syndrome   . Esophageal reflux   . Hiatal hernia   . Migraine, unspecified, without mention of intractable migraine without mention of status migrainosus   . 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 Otosclerosis involving oval window, nonobliterative    Bilateral  . Restless legs syndrome (RLS)     Past Surgical History:  Procedure Laterality Date  . ROBOTIC ASSISTED LAPAROSCOPIC HYSTERECTOMY AND SALPINGECTOMY Bilateral 11/29/2019   Procedure: XI ROBOTIC ASSISTED LAPAROSCOPIC HYSTERECTOMY AND SALPINGECTOMY;  Surgeon: Servando Salina, MD;  Location: [place];  Service: Gynecology;  Laterality: Bilateral;  . STAPEDECTOMY Left 2016   Dr. Cresenciano Lick, with a 4.0 mm titanium Quentin Cornwall prosthesis with a large well and narrow shaft over a vein graft  . STAPEDECTOMY Right 2008    Allergies as of 07/21/2020   No Known Allergies     Medication List       Accurate as of July 21, 2020 11:59 PM. If you have any questions, ask your nurse or doctor.        STOP taking these medications   ibuprofen 800 MG tablet Commonly known as: ADVIL Stopped by: Kathlene November, MD     TAKE these medications   cetirizine 10 MG tablet Commonly known as: ZYRTEC Take 10 mg by mouth daily.   cholecalciferol 1000 units tablet Commonly known as: VITAMIN D Take 1,000 Units by mouth daily.   FISH OIL PO Take 1 capsule by mouth daily.   GINGER PO Take 2 capsules by mouth daily.   ICAPS AREDS FORMULA PO Take 1 capsule by mouth  daily.   omega-3 acid ethyl esters 1 g capsule Commonly known as: LOVAZA Take 1 capsule (1 g total) by mouth 2 (two) times daily.   polyethylene glycol 17 g packet Commonly known as: MIRALAX / GLYCOLAX Take 17 g by mouth daily.   TURMERIC PO Take 1 tablet by mouth daily.          Objective:   Physical Exam BP 98/65 (BP Location: Right Arm, Patient Position: Sitting, Cuff Size: Small)   Pulse 70   Temp 98 F (36.7 C) (Oral)   Resp 16   Ht 5\' 3"  (1.6 m)   Wt 127 lb 6 oz (57.8 kg)   LMP 12/04/2017 (Approximate)   SpO2 100%   BMI 22.56 kg/m  General:   Well developed, NAD, BMI noted.  HEENT:  Normocephalic . Face symmetric, atraumatic. Neck, no thyromegaly Lungs:  CTA B Normal respiratory effort, no intercostal retractions, no accessory muscle use. Heart: RRR,  no murmur.  Abdomen:  Not distended, soft, non-tender. No rebound or rigidity.   Skin: Not pale. Not jaundice Lower extremities: no pretibial edema bilaterally  Neurologic:  alert & oriented X3.  Speech normal, gait appropriate for age and unassisted Psych--  Cognition and judgment appear intact.  Cooperative with normal attention span and concentration.  Behavior appropriate. No anxious or depressed appearing.     Assessment       Assessment  GERD Dyslipidemia Menopausal,  hysterectomy for abnormal Paps 11-2019, cervix pathology acute and chronic cervicitis RLS Migraine headaches HOH , on Florical to prevent calcium deposits in the middle ear EGD 02-2015 wnl, BX negative  PLAN GERD: No symptoms Dyslipidemia: On Lovaza.  Checking labs Menopausal: No symptoms, on vitamin D supplements. DJD: Per rheumatology. CTS: Bilateral, per last rheumatology note, better with splinters. Anemia: Mild, noted on chart review, in the context of recent hysterectomy.  Currently with no GI symptoms.  Check CBC. Hyperglycemia: Noted on chart review, check A1c CMP. RTC 1 year   This visit occurred during the  SARS-CoV-2 public health emergency.  Safety protocols were in place, including screening questions prior to the visit, additional usage of staff PPE, and extensive cleaning of exam room while observing appropriate contact time as indicated for disinfecting solutions.

## 2020-07-22 NOTE — Assessment & Plan Note (Signed)
GERD: No symptoms Dyslipidemia: On Lovaza.  Checking labs Menopausal: No symptoms, on vitamin D supplements. DJD: Per rheumatology. CTS: Bilateral, per last rheumatology note, better with splinters. Anemia: Mild, noted on chart review, in the context of recent hysterectomy.  Currently with no GI symptoms.  Check CBC. Hyperglycemia: Noted on chart review, check A1c CMP. RTC 1 year

## 2020-07-22 NOTE — Assessment & Plan Note (Signed)
Preventive care: Declined CPX. Recommend a flu shot this year Female care: Per gynecology. Up-to-date on CCS and mammograms

## 2020-07-23 LAB — CBC WITH DIFFERENTIAL/PLATELET
Absolute Monocytes: 319 cells/uL (ref 200–950)
Basophils Absolute: 30 cells/uL (ref 0–200)
Basophils Relative: 0.8 %
Eosinophils Absolute: 110 cells/uL (ref 15–500)
Eosinophils Relative: 2.9 %
HCT: 38.3 % (ref 35.0–45.0)
Hemoglobin: 13 g/dL (ref 11.7–15.5)
Lymphs Abs: 1512 cells/uL (ref 850–3900)
MCH: 31.3 pg (ref 27.0–33.0)
MCHC: 33.9 g/dL (ref 32.0–36.0)
MCV: 92.3 fL (ref 80.0–100.0)
MPV: 10.5 fL (ref 7.5–12.5)
Monocytes Relative: 8.4 %
Neutro Abs: 1828 cells/uL (ref 1500–7800)
Neutrophils Relative %: 48.1 %
Platelets: 218 10*3/uL (ref 140–400)
RBC: 4.15 10*6/uL (ref 3.80–5.10)
RDW: 12.5 % (ref 11.0–15.0)
Total Lymphocyte: 39.8 %
WBC: 3.8 10*3/uL (ref 3.8–10.8)

## 2020-07-23 LAB — COMPREHENSIVE METABOLIC PANEL
AG Ratio: 2 (calc) (ref 1.0–2.5)
ALT: 13 U/L (ref 6–29)
AST: 18 U/L (ref 10–35)
Albumin: 4.3 g/dL (ref 3.6–5.1)
Alkaline phosphatase (APISO): 59 U/L (ref 37–153)
BUN: 16 mg/dL (ref 7–25)
CO2: 30 mmol/L (ref 20–32)
Calcium: 9.4 mg/dL (ref 8.6–10.4)
Chloride: 105 mmol/L (ref 98–110)
Creat: 0.86 mg/dL (ref 0.50–1.05)
Globulin: 2.2 g/dL (calc) (ref 1.9–3.7)
Glucose, Bld: 87 mg/dL (ref 65–99)
Potassium: 4.4 mmol/L (ref 3.5–5.3)
Sodium: 141 mmol/L (ref 135–146)
Total Bilirubin: 0.6 mg/dL (ref 0.2–1.2)
Total Protein: 6.5 g/dL (ref 6.1–8.1)

## 2020-07-23 LAB — HEMOGLOBIN A1C
Hgb A1c MFr Bld: 5.4 % of total Hgb (ref <5.7)
Mean Plasma Glucose: 108 (calc)
eAG (mmol/L): 6 (calc)

## 2020-07-23 LAB — LIPID PANEL
Cholesterol: 212 mg/dL — ABNORMAL HIGH (ref <200)
HDL: 68 mg/dL (ref >=50)
LDL Cholesterol (Calc): 130 mg/dL (calc) — ABNORMAL HIGH
Non-HDL Cholesterol (Calc): 144 mg/dL (calc) — ABNORMAL HIGH (ref <130)
Total CHOL/HDL Ratio: 3.1 (calc) (ref <5.0)
Triglycerides: 57 mg/dL (ref <150)

## 2020-09-01 ENCOUNTER — Ambulatory Visit: Payer: Managed Care, Other (non HMO) | Admitting: Internal Medicine

## 2020-09-12 ENCOUNTER — Telehealth: Payer: Self-pay

## 2020-09-12 MED ORDER — OMEGA-3-ACID ETHYL ESTERS 1 G PO CAPS: 1.0000 g | ORAL_CAPSULE | Freq: Two times a day (BID) | ORAL | 5 refills | Status: DC

## 2020-09-12 NOTE — Telephone Encounter (Signed)
Last Visit: 02/26/2020 Next Visit: 02/24/2021  Okay to refill per Dr. Estanislado Pandy

## 2020-09-12 NOTE — Telephone Encounter (Signed)
Patient called requesting prescription refill of Omega-3 to be sent to [facility] at [facility] in [place].

## 2020-09-30 ENCOUNTER — Other Ambulatory Visit: Payer: Self-pay | Admitting: Rheumatology

## 2020-10-15 IMAGING — MG DIGITAL SCREENING BILATERAL MAMMOGRAM WITH TOMO AND CAD
8 series · 8 of 24 positions shown · non-contrast
Comparison: Previous exam(s).

CLINICAL DATA: Screening.

EXAM:
DIGITAL SCREENING BILATERAL MAMMOGRAM WITH TOMO AND CAD

[R MLO synth-2D]
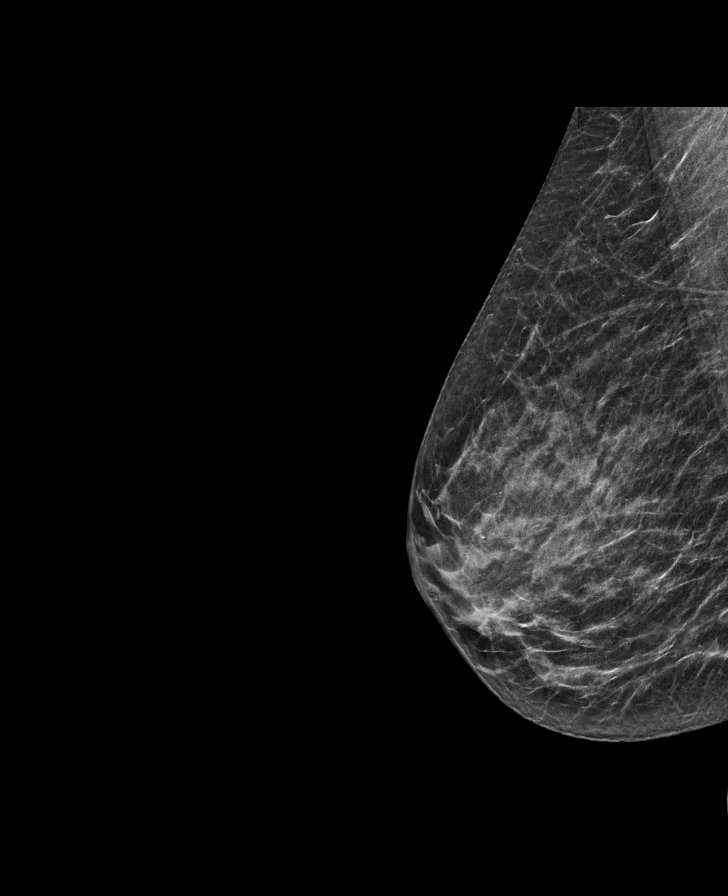

[L MLO synth-2D]
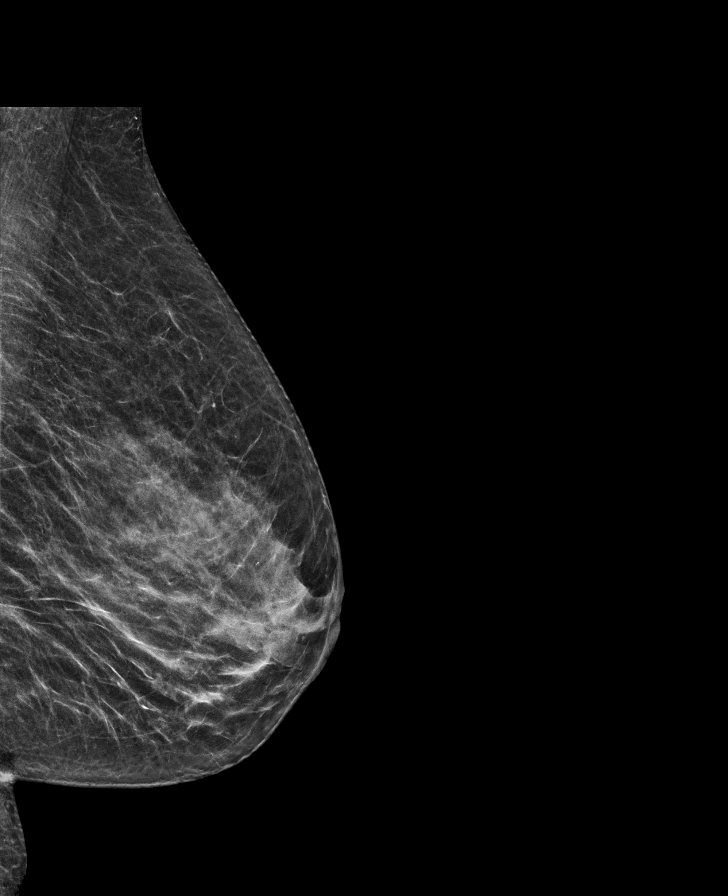

[L CC synth-2D]
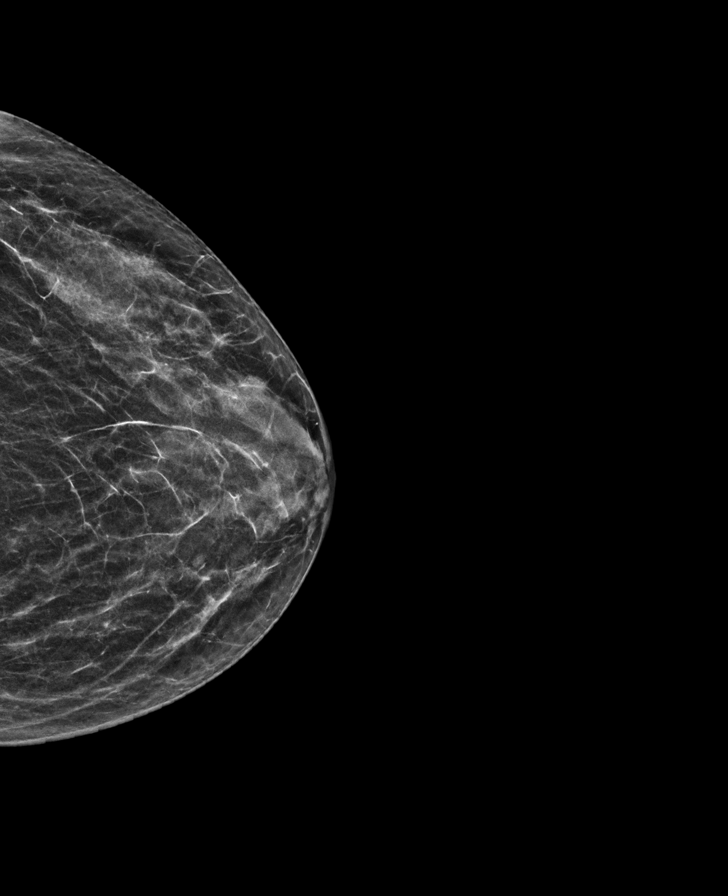

[R CC synth-2D]
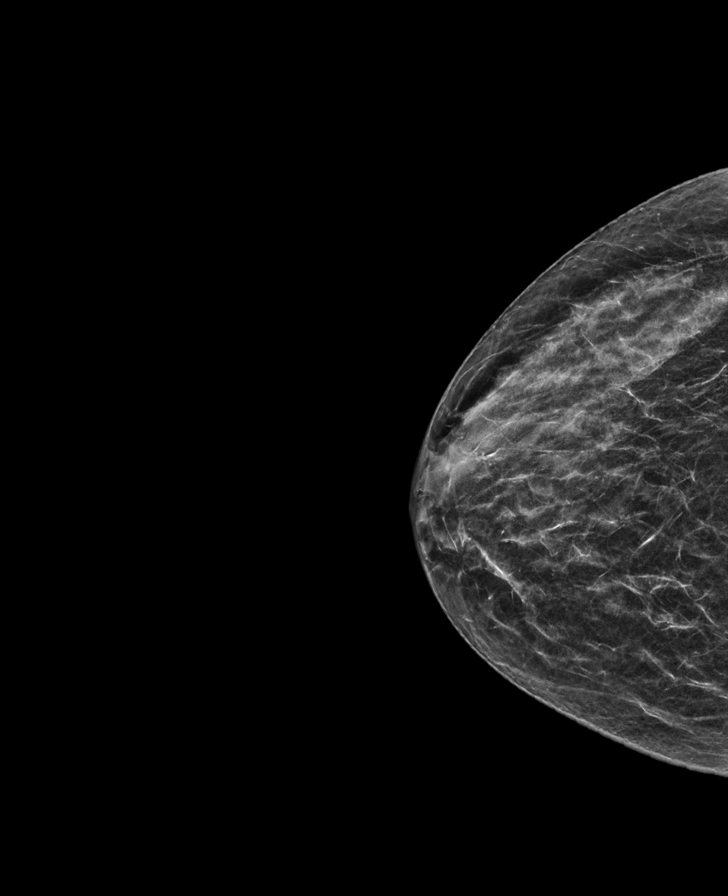

[L MLO tomo · tomo slice 27/53.0]
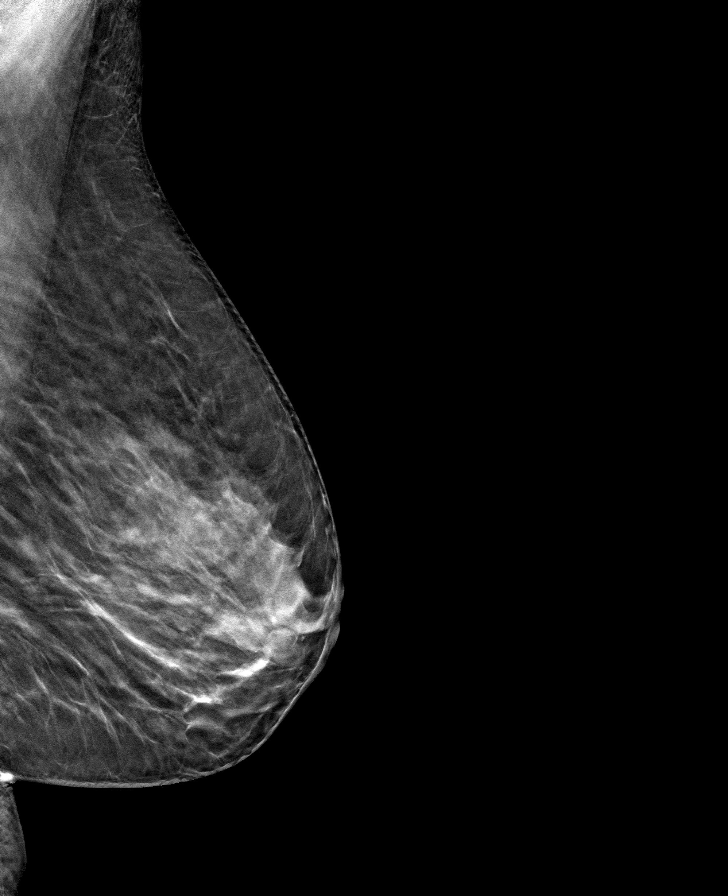

[R CC tomo · tomo slice 25/49.0]
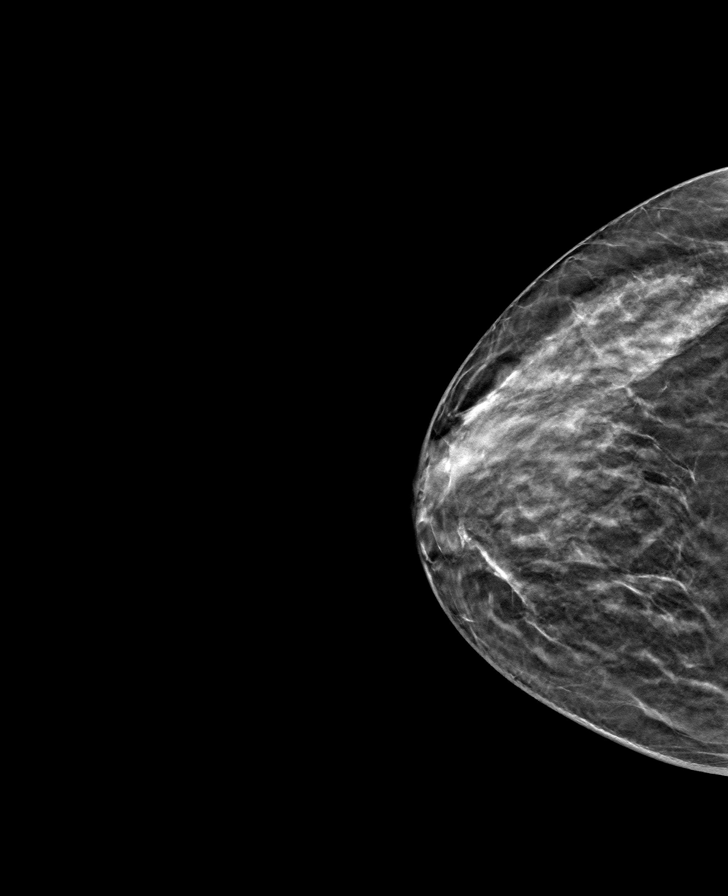

[L CC tomo · tomo slice 25/49.0]
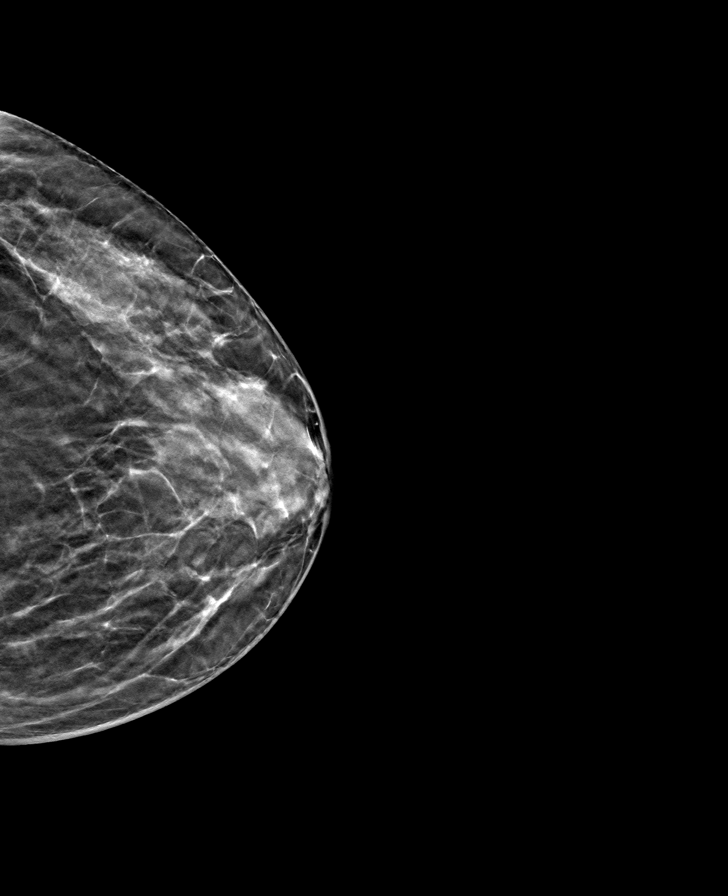

[R MLO tomo · tomo slice 25/49.0]
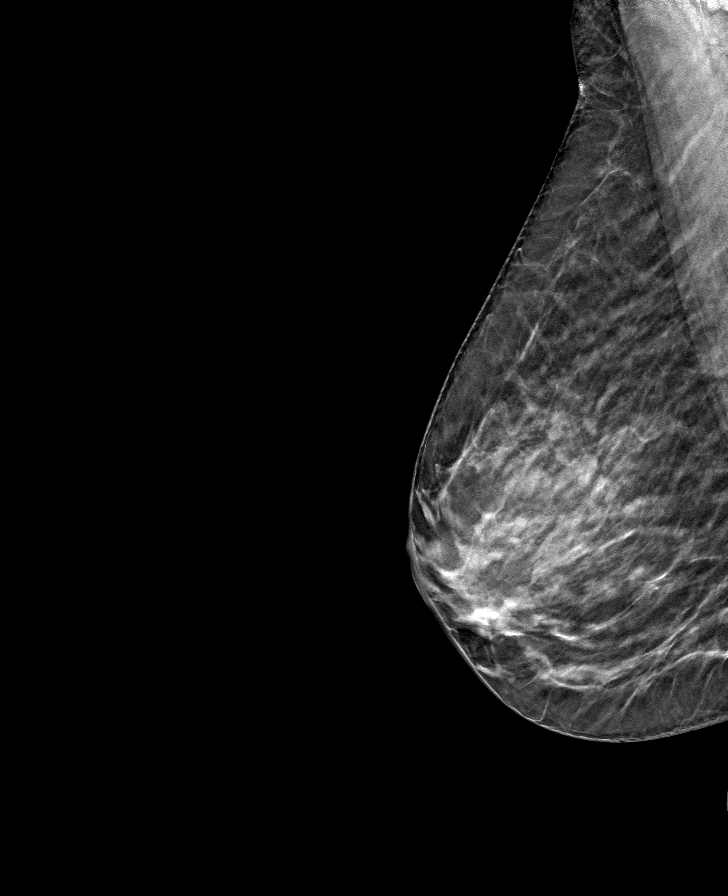

[8 of 24 positions shown; findings below may reference images not displayed]

ACR Breast Density Category c: The breast tissue is heterogeneously
dense, which may obscure small masses.
FINDINGS: There are no findings suspicious for malignancy. Images were
processed with CAD.
IMPRESSION: No mammographic evidence of malignancy. A result letter of this
screening mammogram will be mailed directly to the patient.

RECOMMENDATION:
Screening mammogram in one year. (Code:FT-U-LHB)

BI-RADS CATEGORY  1: Negative.

## 2020-12-05 ENCOUNTER — Other Ambulatory Visit: Payer: Self-pay | Admitting: General Surgery

## 2020-12-05 HISTORY — PX: LIPOMA EXCISION: SHX5283

## 2021-02-10 NOTE — Progress Notes (Signed)
Office Visit Note  Patient: Heather Collins             Date of Birth: 1961-08-28           MRN: [id number]             PCP: Colon Branch, MD Referring: Colon Branch, MD Visit Date: 02/24/2021 Occupation: @GUAROCC @  Subjective:  Pain in hands and feet.   History of Present Illness: Heather Collins is a 60 y.o. female history of osteoarthritis.  She states she continues to have some pain and stiffness in her hands especially in the PIP and DIP joints.  She also has some discomfort in her bilateral feet.  She has pain in her bilateral first MTP joint off and on.  She is also noticed some knots on the ankles.  She is wondering if she has plantar fasciitis.  She has been using braces at night for carpal tunnel syndrome.  Activities of Daily Living:  Patient reports morning stiffness for 0 minutes.   Patient Reports nocturnal pain.  Difficulty dressing/grooming: Denies Difficulty climbing stairs: Denies Difficulty getting out of chair: Denies Difficulty using hands for taps, buttons, cutlery, and/or writing: Denies  Review of Systems  Constitutional: Negative for fatigue, night sweats, weight gain and weight loss.  HENT: Negative for mouth sores, trouble swallowing, trouble swallowing, mouth dryness and nose dryness.   Eyes: Negative for pain, redness, itching, visual disturbance and dryness.  Respiratory: Negative for cough, shortness of breath and difficulty breathing.   Cardiovascular: Negative for chest pain, palpitations, hypertension, irregular heartbeat and swelling in legs/feet.  Gastrointestinal: Negative for blood in stool, constipation and diarrhea.  Endocrine: Negative for increased urination.  Genitourinary: Negative for difficulty urinating and vaginal dryness.  Musculoskeletal: Positive for arthralgias and joint pain. Negative for joint swelling, myalgias, muscle weakness, morning stiffness, muscle tenderness and myalgias.  Skin: Negative for color change, rash, hair loss,  redness, skin tightness, ulcers and sensitivity to sunlight.  Allergic/Immunologic: Negative for susceptible to infections.  Neurological: Negative for dizziness, numbness, headaches, memory loss, night sweats and weakness.  Hematological: Negative for bruising/bleeding tendency and swollen glands.  Psychiatric/Behavioral: Negative for depressed mood, confusion and sleep disturbance. The patient is not nervous/anxious.     PMFS History:  Patient Active Problem List   Diagnosis Date Noted  . Abnormal Pap smear of cervix 11/29/2019  . Status post total hysterectomy 11/29/2019  . Annual physical exam 05/18/2017  . PCP NOTES >>>>>>>>>>>>>> 05/18/2017  . Contact dermatitis due to poison ivy 07/07/2013  . Dizziness and giddiness 05/25/2013  . Dyslipidemia 04/11/2012  . Left upper quadrant pain 04/11/2012  . CARPAL TUNNEL SYNDROME 01/01/2011  . RESTLESS LEG SYNDROME 11/25/2007  . MIGRAINE HEADACHE 11/25/2007  . G E R D 05/31/2007    Past Medical History:  Diagnosis Date  . Carpal tunnel syndrome   . Esophageal reflux   . Hiatal hernia   . Migraine, unspecified, without mention of intractable migraine without mention of status migrainosus   . 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 Otosclerosis involving oval window, nonobliterative    Bilateral  . Restless legs syndrome (RLS)     Family History  Problem Relation Age of Onset  . Heart disease Mother 47       at age 52  . Arthritis Mother   . Lung cancer Father   . Coronary artery disease Paternal Uncle   .  Diabetes Sister   . Healthy Brother   . Healthy Brother   . Healthy Son   . Colon cancer Neg Hx   . Breast cancer Neg Hx    Past Surgical History:  Procedure Laterality Date  . LIPOMA EXCISION  12/05/2020   x2  . ROBOTIC ASSISTED LAPAROSCOPIC HYSTERECTOMY AND SALPINGECTOMY Bilateral 11/29/2019   Procedure: XI ROBOTIC ASSISTED LAPAROSCOPIC HYSTERECTOMY  AND SALPINGECTOMY;  Surgeon: Servando Salina, MD;  Location: [facility];  Service: Gynecology;  Laterality: Bilateral;  . STAPEDECTOMY Left 2016   Dr. Cresenciano Lick, with a 4.0 mm titanium Quentin Cornwall prosthesis with a large well and narrow shaft over a vein graft  . STAPEDECTOMY Right 2008   Social History   Social History Narrative   Son,  1997, going to college   Immunization History  Administered Date(s) Administered  . DTaP 04/04/2013  . Influenza Whole 09/11/2012  . Influenza-Unspecified 08/08/2016, 08/03/2017, 08/07/2019  . PFIZER(Purple Top)SARS-COV-2 Vaccination 12/26/2019, 01/18/2020, 09/05/2020  . Td 11/08/2013     Objective: Vital Signs: BP 104/64 (BP Location: Left Arm, Patient Position: Sitting, Cuff Size: Normal)   Pulse 70   Ht 5' 2.5" (1.588 m)   Wt 128 lb 6.4 oz (58.2 kg)   LMP 12/04/2017 (Approximate)   BMI 23.11 kg/m    Physical Exam Vitals and nursing note reviewed.  Constitutional:      Appearance: She is well-developed.  HENT:     Head: Normocephalic and atraumatic.  Eyes:     Conjunctiva/sclera: Conjunctivae normal.  Cardiovascular:     Rate and Rhythm: Normal rate and regular rhythm.     Heart sounds: Normal heart sounds.  Pulmonary:     Effort: Pulmonary effort is normal.     Breath sounds: Normal breath sounds.  Abdominal:     General: Bowel sounds are normal.     Palpations: Abdomen is soft.  Musculoskeletal:     Cervical back: Normal range of motion.  Lymphadenopathy:     Cervical: No cervical adenopathy.  Skin:    General: Skin is warm and dry.     Capillary Refill: Capillary refill takes less than 2 seconds.  Neurological:     Mental Status: She is alert and oriented to person, place, and time.  Psychiatric:        Behavior: Behavior normal.      Musculoskeletal Exam: C-spine thoracic and lumbar spine with good range of motion.  Shoulder joints, elbow joints, wrist joints, MCPs PIPs and DIPs with good range of motion.   She had bilateral PIP and DIP thickening with no synovitis.  Subluxation of right third DIP joint was noted.  Hip joints, knee joints, ankles with good range of motion.  She has lost fat pad in her feet bilaterally.  She has bilateral first MTP thickening.  Dorsal spurring was noted.  There was no evidence of plantar fasciitis or Achilles tendinitis.  CDAI Exam: CDAI Score: -- Patient Global: --; Provider Global: -- Swollen: --; Tender: -- Joint Exam 02/24/2021   No joint exam has been documented for this visit   There is currently no information documented on the homunculus. Go to the Rheumatology activity and complete the homunculus joint exam.  Investigation: No additional findings.  Imaging: No results found.  Recent Labs: Lab Results  Component Value Date   WBC 3.8 07/21/2020   HGB 13.0 07/21/2020   PLT 218 07/21/2020   NA 141 07/21/2020   K 4.4 07/21/2020   CL 105 07/21/2020  CO2 30 07/21/2020   GLUCOSE 87 07/21/2020   BUN 16 07/21/2020   CREATININE 0.86 07/21/2020   BILITOT 0.6 07/21/2020   ALKPHOS 64 06/25/2019   AST 18 07/21/2020   ALT 13 07/21/2020   PROT 6.5 07/21/2020   ALBUMIN 4.4 06/25/2019   CALCIUM 9.4 07/21/2020   GFRAA >60 11/30/2019    Speciality Comments: No specialty comments available.  Procedures:  No procedures performed Allergies: Patient has no known allergies.   Assessment / Plan:     Visit Diagnoses: Primary osteoarthritis of both hands-joint protection muscle strengthening was discussed.  Handout on hand exercises was given and some of the exercises were demonstrated in the office.  She has been using natural supplements which has been helpful.  I have also advised use of arthritis gloves.  Bilateral carpal tunnel syndrome-she uses carpal tunnel braces at night.  Primary osteoarthritis of both feet-she has bilateral first MTP thickening and dorsal spurring.  Proper fitting shoes with arch support were discussed.  I have discouraged  the use of walking barefoot on the hardwood floors.  She will see a podiatrist for orthotics.  Feet muscle strengthening exercises were discussed.  Other medical problems listed as follows:  History of gastroesophageal reflux (GERD)  Hx of migraines  Dyslipidemia  RLS (restless legs syndrome)  Orders: No orders of the defined types were placed in this encounter.  Meds ordered this encounter  Medications  . omega-3 acid ethyl esters (LOVAZA) 1 g capsule    Sig: Take 1 capsule (1 g total) by mouth 2 (two) times daily.    Dispense:  60 capsule    Refill:  5     Follow-Up Instructions: Return in about 6 months (around 08/26/2021) for Osteoarthritis.   Bo Merino, MD  Note - This record has been created using Editor, commissioning.  Chart creation errors have been sought, but may not always  have been located. Such creation errors do not reflect on  the standard of medical care.

## 2021-02-24 ENCOUNTER — Encounter: Payer: Self-pay | Admitting: Rheumatology

## 2021-02-24 ENCOUNTER — Ambulatory Visit: Payer: Managed Care, Other (non HMO) | Admitting: Rheumatology

## 2021-02-24 ENCOUNTER — Other Ambulatory Visit: Payer: Self-pay

## 2021-02-24 VITALS — BP 104/64 | HR 70 | Ht 62.5 in | Wt 128.4 lb

## 2021-02-24 DIAGNOSIS — E785 Hyperlipidemia, unspecified: Secondary | ICD-10-CM

## 2021-02-24 DIAGNOSIS — Z8719 Personal history of other diseases of the digestive system: Secondary | ICD-10-CM | POA: Diagnosis not present

## 2021-02-24 DIAGNOSIS — G5603 Carpal tunnel syndrome, bilateral upper limbs: Secondary | ICD-10-CM | POA: Diagnosis not present

## 2021-02-24 DIAGNOSIS — M19071 Primary osteoarthritis, right ankle and foot: Secondary | ICD-10-CM | POA: Diagnosis not present

## 2021-02-24 DIAGNOSIS — M19041 Primary osteoarthritis, right hand: Secondary | ICD-10-CM | POA: Diagnosis not present

## 2021-02-24 DIAGNOSIS — Z8669 Personal history of other diseases of the nervous system and sense organs: Secondary | ICD-10-CM

## 2021-02-24 DIAGNOSIS — G2581 Restless legs syndrome: Secondary | ICD-10-CM

## 2021-02-24 DIAGNOSIS — M19072 Primary osteoarthritis, left ankle and foot: Secondary | ICD-10-CM

## 2021-02-24 DIAGNOSIS — M19042 Primary osteoarthritis, left hand: Secondary | ICD-10-CM

## 2021-02-24 MED ORDER — OMEGA-3-ACID ETHYL ESTERS 1 G PO CAPS: 1.0000 g | ORAL_CAPSULE | Freq: Two times a day (BID) | ORAL | 5 refills | Status: DC

## 2021-02-24 NOTE — Patient Instructions (Signed)
Hand Exercises Hand exercises can be helpful for almost anyone. These exercises can strengthen the hands, improve flexibility and movement, and increase blood flow to the hands. These results can make work and daily tasks easier. Hand exercises can be especially helpful for people who have joint pain from arthritis or have nerve damage from overuse (carpal tunnel syndrome). These exercises can also help people who have injured a hand. Exercises Most of these hand exercises are gentle stretching and motion exercises. It is usually safe to do them often throughout the day. Warming up your hands before exercise may help to reduce stiffness. You can do this with gentle massage or by placing your hands in warm water for 10-15 minutes. It is normal to feel some stretching, pulling, tightness, or mild discomfort as you begin new exercises. This will gradually improve. Stop an exercise right away if you feel sudden, severe pain or your pain gets worse. Ask your health care provider which exercises are best for you. Knuckle bend or "claw" fist 1. Stand or sit with your arm, hand, and all five fingers pointed straight up. Make sure to keep your wrist straight during the exercise. 2. Gently bend your fingers down toward your palm until the tips of your fingers are touching the top of your palm. Keep your big knuckle straight and just bend the small knuckles in your fingers. 3. Hold this position for __________ seconds. 4. Straighten (extend) your fingers back to the starting position. Repeat this exercise 5-10 times with each hand. Full finger fist 1. Stand or sit with your arm, hand, and all five fingers pointed straight up. Make sure to keep your wrist straight during the exercise. 2. Gently bend your fingers into your palm until the tips of your fingers are touching the middle of your palm. 3. Hold this position for __________ seconds. 4. Extend your fingers back to the starting position, stretching every  joint fully. Repeat this exercise 5-10 times with each hand. Straight fist 1. Stand or sit with your arm, hand, and all five fingers pointed straight up. Make sure to keep your wrist straight during the exercise. 2. Gently bend your fingers at the big knuckle, where your fingers meet your hand, and the middle knuckle. Keep the knuckle at the tips of your fingers straight and try to touch the bottom of your palm. 3. Hold this position for __________ seconds. 4. Extend your fingers back to the starting position, stretching every joint fully. Repeat this exercise 5-10 times with each hand. Tabletop 1. Stand or sit with your arm, hand, and all five fingers pointed straight up. Make sure to keep your wrist straight during the exercise. 2. Gently bend your fingers at the big knuckle, where your fingers meet your hand, as far down as you can while keeping the small knuckles in your fingers straight. Think of forming a tabletop with your fingers. 3. Hold this position for __________ seconds. 4. Extend your fingers back to the starting position, stretching every joint fully. Repeat this exercise 5-10 times with each hand. Finger spread 1. Place your hand flat on a table with your palm facing down. Make sure your wrist stays straight as you do this exercise. 2. Spread your fingers and thumb apart from each other as far as you can until you feel a gentle stretch. Hold this position for __________ seconds. 3. Bring your fingers and thumb tight together again. Hold this position for __________ seconds. Repeat this exercise 5-10 times with each hand.   Making circles 1. Stand or sit with your arm, hand, and all five fingers pointed straight up. Make sure to keep your wrist straight during the exercise. 2. Make a circle by touching the tip of your thumb to the tip of your index finger. 3. Hold for __________ seconds. Then open your hand wide. 4. Repeat this motion with your thumb and each finger on your  hand. Repeat this exercise 5-10 times with each hand. Thumb motion 1. Sit with your forearm resting on a table and your wrist straight. Your thumb should be facing up toward the ceiling. Keep your fingers relaxed as you move your thumb. 2. Lift your thumb up as high as you can toward the ceiling. Hold for __________ seconds. 3. Bend your thumb across your palm as far as you can, reaching the tip of your thumb for the small finger (pinkie) side of your palm. Hold for __________ seconds. Repeat this exercise 5-10 times with each hand. Grip strengthening 1. Hold a stress ball or other soft ball in the middle of your hand. 2. Slowly increase the pressure, squeezing the ball as much as you can without causing pain. Think of bringing the tips of your fingers into the middle of your palm. All of your finger joints should bend when doing this exercise. 3. Hold your squeeze for __________ seconds, then relax. Repeat this exercise 5-10 times with each hand.   Contact a health care provider if:  Your hand pain or discomfort gets much worse when you do an exercise.  Your hand pain or discomfort does not improve within 2 hours after you exercise. If you have any of these problems, stop doing these exercises right away. Do not do them again unless your health care provider says that you can. Get help right away if:  You develop sudden, severe hand pain or swelling. If this happens, stop doing these exercises right away. Do not do them again unless your health care provider says that you can. This information is not intended to replace advice given to you by your health care provider. Make sure you discuss any questions you have with your health care provider. Document Revised: 02/15/2019 Document Reviewed: 10/26/2018 Elsevier Patient Education  2021 Elsevier [employer].  

## 2021-03-09 ENCOUNTER — Telehealth: Payer: Self-pay

## 2021-03-09 NOTE — Telephone Encounter (Signed)
Patient left a voicemail stating Dr. Estanislado Pandy mentioned referring her to a Physical Therapist to have ring splint for a couple of her fingers.  Patient requested a return call to let her know who she would recommend.

## 2021-03-10 ENCOUNTER — Other Ambulatory Visit: Payer: Self-pay | Admitting: *Deleted

## 2021-03-10 DIAGNOSIS — M19041 Primary osteoarthritis, right hand: Secondary | ICD-10-CM

## 2021-03-10 NOTE — Telephone Encounter (Signed)
Referral placed.

## 2021-03-10 NOTE — Telephone Encounter (Signed)
Ok to place referral to hand center for ring splints.

## 2021-03-11 ENCOUNTER — Encounter: Payer: Self-pay | Admitting: Gastroenterology

## 2021-03-26 ENCOUNTER — Other Ambulatory Visit: Payer: Self-pay

## 2021-03-26 MED ORDER — OMEGA-3-ACID ETHYL ESTERS 1 G PO CAPS: 1.0000 g | ORAL_CAPSULE | Freq: Two times a day (BID) | ORAL | 1 refills | Status: DC

## 2021-03-26 NOTE — Telephone Encounter (Signed)
We had sent a prescription for omega 3 to the local pharmacy. Patient is requesting a 90 day supply to [facility]. Please review and send prescription. Thanks!

## 2021-03-26 NOTE — Telephone Encounter (Signed)
Patient called stating she received an email from [facility] letting her know she can get her Omega-3 (3 month supply) through [facility] and it would be less expensive than a 1 month supply through her local pharmacy.   Patient requested we fax the 3 month prescription to [phone] attn:  Pharmacy

## 2021-03-27 NOTE — Telephone Encounter (Signed)
Attempted to contact patient and left message on machine to advise patient that omega 3 prescription has been sent to [facility].

## 2021-04-07 ENCOUNTER — Ambulatory Visit: Payer: Managed Care, Other (non HMO) | Admitting: Internal Medicine

## 2021-04-07 ENCOUNTER — Other Ambulatory Visit: Payer: Self-pay

## 2021-04-07 ENCOUNTER — Encounter: Payer: Self-pay | Admitting: Internal Medicine

## 2021-04-07 ENCOUNTER — Ambulatory Visit (HOSPITAL_BASED_OUTPATIENT_CLINIC_OR_DEPARTMENT_OTHER)
Admission: RE | Admit: 2021-04-07 | Discharge: 2021-04-07 | Disposition: A | Discharge status: Home / Self Care | Payer: Managed Care, Other (non HMO) | Source: Ambulatory Visit | Attending: Internal Medicine | Admitting: Internal Medicine

## 2021-04-07 ENCOUNTER — Telehealth: Payer: Self-pay | Admitting: Internal Medicine

## 2021-04-07 VITALS — BP 118/84 | HR 65 | Temp 97.8°F | Resp 16 | Ht 62.5 in | Wt 127.5 lb

## 2021-04-07 DIAGNOSIS — R930 Abnormal findings on diagnostic imaging of skull and head, not elsewhere classified: Secondary | ICD-10-CM

## 2021-04-07 DIAGNOSIS — W19XXXA Unspecified fall, initial encounter: Secondary | ICD-10-CM | POA: Insufficient documentation

## 2021-04-07 DIAGNOSIS — Y939 Activity, unspecified: Secondary | ICD-10-CM | POA: Insufficient documentation

## 2021-04-07 DIAGNOSIS — Y929 Unspecified place or not applicable: Secondary | ICD-10-CM | POA: Diagnosis not present

## 2021-04-07 DIAGNOSIS — R519 Headache, unspecified: Secondary | ICD-10-CM | POA: Insufficient documentation

## 2021-04-07 NOTE — Patient Instructions (Signed)
Proceed with th XR      Concussion, Adult  A concussion is a brain injury from a hard, direct hit (trauma) to your head or body. This direct hit causes your brain to quickly shake back and forth inside your skull. A concussion may also be called a mild traumatic brain injury (TBI). Healing from this injury can take time. What are the causes? This condition is caused by:  A direct hit to your head, such as: ? Running into a player during a game. ? Being hit in a fight. ? Hitting your head on a hard surface.  A quick and sudden movement of the head or neck, such as in a car crash. What are the signs or symptoms? The signs of a concussion can be hard to notice. They may be missed by you, family members, and doctors. You may look fine on the outside but may not act or feel normal. Physical symptoms  Headaches.  Being dizzy.  Problems with body balance.  Being sensitive to light or noise.  Vomiting or feeling like you may vomit.  Being tired.  Problems seeing or hearing.  Not sleeping or eating as you used to.  Seizure. Mental and emotional symptoms  Feeling grouchy (irritable).  Having mood changes.  Problems remembering things.  Trouble focusing your mind (concentrating), organizing, or making decisions.  Being slow to think, act, react, speak, or read.  Feeling worried or nervous (anxious).  Feeling sad (depressed). How is this treated? This condition may be treated by:  Stopping sports or activity if you are injured. If you hit your head or have signs of concussion: ? Do not return to sports or activities the same day. ? Get checked by a doctor before you return to your activities.  Resting your body and your mind.  Being watched carefully, often at home.  Medicines to help with symptoms such as: ? Headaches. ? Feeling like you may vomit. ? Problems with sleep.  Avoiding alcohol and drugs.  Being asked to go to a concussion clinic or a place to  help you recover (rehabilitation center). Recovery from a concussion can take time. Return to activities only:  When you are fully healed.  When your doctor says it is safe. Avoid taking strong pain medicines (opioids) for a concussion. Follow these instructions at home: Activity  Limit activities that need a lot of thought or focus, such as: ? Homework or work for your job. ? Watching TV. ? Using the computer or phone. ? Playing memory games and puzzles.  Rest. Rest helps your brain heal. Make sure you: ? Get plenty of sleep. Most adults should get 7-9 hours of sleep each night. ? Rest during the day. Take naps or breaks when you feel tired.  Avoid activity like exercise until your doctor says its safe. Stop any activity that makes symptoms worse.  Do not do activities that could cause a second concussion, such as riding a bike or playing sports.  Ask your doctor when you can return to your normal activities, such as school, work, sports, and driving. Your ability to react may be slower. Do not do these activities if you are dizzy. General instructions  Take over-the-counter and prescription medicines only as told by your doctor.  Do not drink alcohol until your doctor says you can.  Watch your symptoms and tell other people to do the same. Other problems can occur after a concussion. Older adults have a higher risk of serious problems.  Tell your work Freight forwarder, teachers, Government social research officer, school counselor, coach, or Product/process development scientist about your injury and symptoms. Tell them about what you can or cannot do.  Keep all follow-up visits as told by your doctor. This is important.   How is this prevented? It is very important that you do not get another brain injury. In rare cases, another injury can cause brain damage that will not go away, brain swelling, or death. The risk of this is greatest in the first 7-10 days after a head injury. To avoid injuries:  Stop activities that could  lead to a second concussion, such as contact sports, until your doctor says it is okay.  When you return to sports or activities: ? Do not crash into other players. This is how most concussions happen. ? Follow the rules. ? Respect other players. Do not engage in violent behavior while playing.  Get regular exercise. Do strength and balance training.  Wear a helmet that fits you well during sports, biking, or other activities.  Helmets can help protect you from serious skull and brain injuries, but they do not protect you from a concussion. Even when wearing a helmet, you should avoid being hit in the head. Contact a doctor if:  Your symptoms do not get better.  You have new symptoms.  You have another injury. Get help right away if:  You have bad headaches or your headaches get worse.  You feel weak or numb in any part of your body.  You feel mixed up (confused).  Your balance gets worse.  You vomit often.  You feel more sleepy than normal.  You cannot speak well, or have slurred speech.  You have a seizure.  Others have trouble waking you up.  You have changes in how you act.  You have changes in how you see (vision).  You pass out (lose consciousness). These symptoms may be an emergency. Do not wait to see if the symptoms will go away. Get medical help right away. Call your local emergency services (911 in the [place] Do not drive yourself to the hospital. Summary  A concussion is a brain injury from a hard, direct hit (trauma) to your head or body.  This condition is treated with rest and careful watching of symptoms.  Ask your doctor when you can return to your normal activities, such as school, work, or driving.  Get help right away if you have a very bad headache, feel weak in any part of your body, have a seizure, have changes in how you act or see, or if you are mixed up or more sleepy than normal. This information is not intended to replace advice given  to you by your health care provider. Make sure you discuss any questions you have with your health care provider. Document Revised: 09/06/2019 Document Reviewed: 09/06/2019 Elsevier Patient Education  [place].

## 2021-04-07 NOTE — Assessment & Plan Note (Signed)
Fall and head injury: As described above, she does not have red flag symptoms, neuro exam normal. She is TTP at a very specific area of the skull, see graphic. Plan: X-ray, if there is any suspicion for fracture we will proceed with a CT.  Otherwise observation, watch for concussion type of symptoms, see AVS.  Continue ice and Tylenol

## 2021-04-07 NOTE — Progress Notes (Signed)
Subjective:    Patient ID: Heather Collins, female    DOB: [birth year], 60 y.o.   MRN: [id number]  DOS:  04/07/2021 Type of visit - description: Acute 4 days ago, at home, had an accidental fall. She went backwards, she soften the fall with her hands but hit the back of her head on a chair. She developed a hematoma, she has been icing it every day. No LOC No neck pain per se No nausea or vomiting No headache No visual disturbances.    Review of Systems See above   Past Medical History:  Diagnosis Date  . Carpal tunnel syndrome   . Esophageal reflux   . Hiatal hernia   . Migraine, unspecified, without mention of intractable migraine without mention of status migrainosus   . 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 Otosclerosis involving oval window, nonobliterative    Bilateral  . Restless legs syndrome (RLS)     Past Surgical History:  Procedure Laterality Date  . LIPOMA EXCISION  12/05/2020   x2  . ROBOTIC ASSISTED LAPAROSCOPIC HYSTERECTOMY AND SALPINGECTOMY Bilateral 11/29/2019   Procedure: XI ROBOTIC ASSISTED LAPAROSCOPIC HYSTERECTOMY AND SALPINGECTOMY;  Surgeon: Servando Salina, MD;  Location: [place];  Service: Gynecology;  Laterality: Bilateral;  . STAPEDECTOMY Left 2016   Dr. Cresenciano Lick, with a 4.0 mm titanium Quentin Cornwall prosthesis with a large well and narrow shaft over a vein graft  . STAPEDECTOMY Right 2008    Allergies as of 04/07/2021   No Known Allergies     Medication List       Accurate as of Apr 07, 2021  2:10 PM. If you have any questions, ask your nurse or doctor.        cetirizine 10 MG tablet Commonly known as: ZYRTEC Take 10 mg by mouth daily.   cholecalciferol 1000 units tablet Commonly known as: VITAMIN D Take 1,000 Units by mouth daily.   GINGER PO Take 2 capsules by mouth daily.   ICAPS AREDS FORMULA PO Take 1 capsule by mouth daily.   omega-3  acid ethyl esters 1 g capsule Commonly known as: LOVAZA Take 1 capsule (1 g total) by mouth 2 (two) times daily.   polyethylene glycol 17 g packet Commonly known as: MIRALAX / GLYCOLAX Take 17 g by mouth daily.   TURMERIC PO Take 1 tablet by mouth daily.          Objective:   Physical Exam HENT:     Head:     BP 118/84 (BP Location: Left Arm, Patient Position: Sitting, Cuff Size: Small)   Pulse 65   Temp 97.8 F (36.6 C) (Oral)   Resp 16   Ht 5' 2.5" (1.588 m)   Wt 127 lb 8 oz (57.8 kg)   LMP 12/04/2017 (Approximate)   SpO2 98%   BMI 22.95 kg/m  General:   Well developed, NAD, BMI noted. HEENT:  Normocephalic . Face symmetric, atraumatic Neck: No TTP of the cervical spine, range of motion is normal without pain. Lower extremities: no pretibial edema bilaterally  Skin: Not pale. Not jaundice Neurologic:  alert & oriented X3.  Speech normal, gait appropriate for age and unassisted Motor symmetric, EOMI. Psych--  Cognition and judgment appear intact.  Cooperative with normal attention span and concentration.  Behavior appropriate. No anxious or depressed appearing.      Assessment       Assessment  GERD Dyslipidemia Menopausal, hysterectomy  for abnormal Paps 11-2019, cervix pathology acute and chronic cervicitis RLS Migraine headaches HOH , on Florical to prevent calcium deposits in the middle ear EGD 02-2015 wnl, BX negative  PLAN Fall and head injury: As described above, she does not have red flag symptoms, neuro exam normal. She is TTP at a very specific area of the skull, see graphic. Plan: X-ray, if there is any suspicion for fracture we will proceed with a CT.  Otherwise observation, watch for concussion type of symptoms, see AVS.  Continue ice and Tylenol  This visit occurred during the SARS-CoV-2 public health emergency.  Safety protocols were in place, including screening questions prior to the visit, additional usage of staff PPE, and  extensive cleaning of exam room while observing appropriate contact time as indicated for disinfecting solutions.

## 2021-04-08 ENCOUNTER — Ambulatory Visit
Admission: RE | Admit: 2021-04-08 | Discharge: 2021-04-08 | Disposition: A | Discharge status: Home / Self Care | Payer: Managed Care, Other (non HMO) | Source: Ambulatory Visit | Attending: Internal Medicine | Admitting: Internal Medicine

## 2021-04-08 NOTE — Addendum Note (Signed)
Addended byDamita Dunnings D on: 04/08/2021 09:56 AM   Modules accepted: Orders

## 2021-05-14 ENCOUNTER — Telehealth: Payer: Self-pay | Admitting: Gastroenterology

## 2021-05-27 ENCOUNTER — Telehealth: Payer: Self-pay | Admitting: Gastroenterology

## 2021-05-27 NOTE — Telephone Encounter (Signed)
Can you please call her and set her up for a colon and pre visit with Dr Ardis Hughs?

## 2021-05-27 NOTE — Telephone Encounter (Signed)
Patient returning missed call..plz adviise thank you

## 2021-05-27 NOTE — Telephone Encounter (Signed)
Left message on machine to call back  

## 2021-05-27 NOTE — Telephone Encounter (Signed)
I know her husband quite well and he asked me to help with scheduling a screening colonoscopy with me.   Can you reach out to her to help coordinate?  thanks   Best cell phone contact is [phone]

## 2021-05-28 ENCOUNTER — Encounter: Payer: Self-pay | Admitting: Gastroenterology

## 2021-05-28 NOTE — Telephone Encounter (Signed)
Called patient LVM to return call per Dr. Ardis Hughs to schedule colon/previsit appt. Thank you

## 2021-05-28 NOTE — Telephone Encounter (Signed)
Patient has been scheduled for 07/01/21

## 2021-06-16 ENCOUNTER — Ambulatory Visit (AMBULATORY_SURGERY_CENTER): Payer: Managed Care, Other (non HMO)

## 2021-06-16 ENCOUNTER — Other Ambulatory Visit: Payer: Self-pay

## 2021-06-16 VITALS — Ht 62.5 in | Wt 126.0 lb

## 2021-06-16 DIAGNOSIS — Z1211 Encounter for screening for malignant neoplasm of colon: Secondary | ICD-10-CM

## 2021-06-16 MED ORDER — ONDANSETRON HCL 4 MG PO TABS: 4.0000 mg | ORAL_TABLET | ORAL | 0 refills | Status: DC

## 2021-06-16 NOTE — Progress Notes (Signed)
Patient's pre-visit was done today over the phone with the patient Name,DOB and address verified. Patient denies any allergies to Eggs and Soy. Patient denies any problems with anesthesia/sedation. Patient denies taking diet pills or blood thinners. No home Oxygen. Packet of Prep instructions mailed to patient including a copy of a consent form-pt is aware. Patient understands to call us back with any questions or concerns. Patient is aware of our care-partner policy and 0000000 safety protocol.   EMMI education assigned to the patient for the procedure, sent to [facility].   The patient is COVID-19 vaccinated.     Discussed constipation with pt.  She notes she has a lazy bowel and has been taking Miralax daily since Dr. Sharlett Iles instructed her.  She notes she goes daily doing this.    Pt will continue with daily Miralax and based on her intolerance and nausea last time, Zofran will be ordered for her as well.

## 2021-06-29 ENCOUNTER — Encounter: Payer: Self-pay | Admitting: Gastroenterology

## 2021-07-01 ENCOUNTER — Encounter: Payer: Self-pay | Admitting: Gastroenterology

## 2021-07-01 ENCOUNTER — Ambulatory Visit (AMBULATORY_SURGERY_CENTER): Payer: Managed Care, Other (non HMO) | Admitting: Gastroenterology

## 2021-07-01 ENCOUNTER — Other Ambulatory Visit: Payer: Self-pay

## 2021-07-01 VITALS — BP 100/64 | HR 54 | Temp 98.2°F | Resp 14 | Ht 62.5 in | Wt 126.0 lb

## 2021-07-01 DIAGNOSIS — D128 Benign neoplasm of rectum: Secondary | ICD-10-CM

## 2021-07-01 DIAGNOSIS — Z1211 Encounter for screening for malignant neoplasm of colon: Secondary | ICD-10-CM

## 2021-07-01 DIAGNOSIS — D124 Benign neoplasm of descending colon: Secondary | ICD-10-CM | POA: Diagnosis not present

## 2021-07-01 MED ORDER — SODIUM CHLORIDE 0.9 % IV SOLN: 500.0000 mL | Freq: Once | INTRAVENOUS | Status: DC

## 2021-07-01 NOTE — Progress Notes (Signed)
VS completed by CW.   Pt's states no medical or surgical changes since previsit or office visit.  

## 2021-07-01 NOTE — Op Note (Signed)
[facility] Patient Name: Heather Collins Procedure Date: 07/01/2021 8:26 AM MRN: [id number] Endoscopist: Milus Banister , MD Age: 60 Referring MD:  Date of Birth: [birth year] Gender: Female Account #: [id number] Procedure:                Colonoscopy Indications:              Screening for colorectal malignant neoplasm Medicines:                Monitored Anesthesia Care Procedure:                Pre-Anesthesia Assessment:                           - Prior to the procedure, a History and Physical                            was performed, and patient medications and                            allergies were reviewed. The patient's tolerance of                            previous anesthesia was also reviewed. The risks                            and benefits of the procedure and the sedation                            options and risks were discussed with the patient.                            All questions were answered, and informed consent                            was obtained. Prior Anticoagulants: The patient has                            taken no previous anticoagulant or antiplatelet                            agents. ASA Grade Assessment: II - A patient with                            mild systemic disease. After reviewing the risks                            and benefits, the patient was deemed in                            satisfactory condition to undergo the procedure.                           After obtaining informed consent, the colonoscope  was passed under direct vision. Throughout the                            procedure, the patient's blood pressure, pulse, and                            oxygen saturations were monitored continuously. The                            Olympus [id number]) Colonoscope was                            introduced through the anus and advanced to the the                            cecum, identified by  appendiceal orifice and                            ileocecal valve. The colonoscopy was performed                            without difficulty. The patient tolerated the                            procedure well. The quality of the bowel                            preparation was good. The ileocecal valve,                            appendiceal orifice, and rectum were photographed. Scope In: 8:31:45 AM Scope Out: 8:50:52 AM Scope Withdrawal Time: 0 hours 13 minutes 16 seconds  Total Procedure Duration: 0 hours 19 minutes 7 seconds  Findings:                 Two sessile polyps were found in the rectum and                            descending colon. The polyps were 2 to 3 mm in                            size. These polyps were removed with a cold snare.                            Resection and retrieval were complete.                           The exam was otherwise without abnormality on                            direct and retroflexion views. Complications:            No immediate complications. Estimated blood loss:  None. Estimated Blood Loss:     Estimated blood loss: none. Impression:               - Two 2 to 3 mm polyps in the rectum and in the                            descending colon, removed with a cold snare.                            Complete resection and retrieval.                           - The examination was otherwise normal on direct                            and retroflexion views. Recommendation:           - Patient has a contact number available for                            emergencies. The signs and symptoms of potential                            delayed complications were discussed with the                            patient. Return to normal activities tomorrow.                            Written discharge instructions were provided to the                            patient.                           - Resume previous diet.                            - Continue present medications.                           - Await pathology results. Milus Banister, MD 07/01/2021 8:53:51 AM This report has been signed electronically.

## 2021-07-01 NOTE — Progress Notes (Signed)
PT taken to PACU. Monitors in place. VSS. Report given to RN. 

## 2021-07-01 NOTE — Progress Notes (Signed)
HPI: This is a woman at routine risk for colon cancer   ROS: complete GI ROS as described in HPI, all other review negative.  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 Cresenciano Lick, with a 4.0 mm titanium Quentin Cornwall prosthesis with a large well and narrow shaft over a vein graft   STAPEDECTOMY Right 2008   UPPER GASTROINTESTINAL ENDOSCOPY      Current Outpatient Medications  Medication Sig Dispense Refill   CALCIUM PO Take by mouth.     cetirizine (ZYRTEC) 10 MG tablet Take 10 mg by mouth daily.     Cyanocobalamin (B-12 PO) Take by mouth.     Ginger, Zingiber officinalis, (GINGER PO) Take 2 capsules by mouth daily.      Glucosamine HCl (GLUCOSAMINE PO) Take by mouth.     omega-3 acid ethyl esters (LOVAZA) 1 g capsule Take 1 capsule (1 g total) by mouth 2 (two) times daily. 180 capsule 1   Omeprazole Magnesium (PRILOSEC PO) Take by mouth.     ondansetron (ZOFRAN) 4 MG tablet Take 1 tablet (4 mg total) by mouth as directed. 1 table 30-60 minutes prior to each prep dose. 2 tablet 0   sodium fluoride-calcium carbonate (FLORICAL) 8.3-364 MG CAPS  capsule Florical 3.75 mg (8.25)-145 mg (364) capsule     TART CHERRY PO Take by mouth.     TURMERIC PO Take 1 tablet by mouth daily.      cholecalciferol (VITAMIN D) 1000 UNITS tablet Take 1,000 Units by mouth daily.     Current Facility-Administered Medications  Medication Dose Route Frequency Provider Last Rate Last Admin   0.9 %  sodium chloride infusion  500 mL Intravenous Once Milus Banister, MD        Allergies as of 07/01/2021   (No Known Allergies)    Family History  Problem Relation Age of Onset   Heart disease Mother 59       at age 17   Arthritis Mother    Lung cancer Father    Diabetes Sister    Healthy Brother    Healthy Brother    Coronary artery disease Paternal Uncle    Healthy Son    Colon cancer Neg Hx    Breast cancer Neg Hx    Colon polyps Neg Hx    Esophageal cancer Neg Hx    Stomach cancer Neg Hx    Rectal cancer Neg Hx     Social History   Socioeconomic History   Marital status: Married    Spouse name: Not on file   Number of children: 1   Years of education: Not on file  Highest education level: Not on file  Occupational History   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  Tobacco Use   Smoking status: Former    Packs/day: 0.25    Years: 4.00    Pack years: 1.00    Types: Cigarettes    Quit date: 11/27/1993    Years since quitting: 27.6   Smokeless tobacco: Never   Tobacco comments:    quit in the 90s  Vaping Use   Vaping Use: Never used  Substance and Sexual Activity   Alcohol use: No   Drug use: Never   Sexual activity: Not on file  Other Topics Concern   Not on file  Social History Narrative   Son,  1997, going to college   Social Determinants of Health   Financial Resource Strain: Not on file  Food Insecurity: Not on file  Transportation Needs: Not on file  Physical Activity: Not on file  Stress: Not on file  Social Connections: Not on file  Intimate Partner Violence: Not on file     Physical Exam: BP 105/67   Pulse 67    Temp 98.2 F (36.8 C)   Ht 5' 2.5" (1.588 m)   Wt 126 lb (57.2 kg)   LMP 12/04/2017 (Approximate)   SpO2 98%   BMI 22.68 kg/m  Constitutional: generally well-appearing Psychiatric: alert and oriented x3 Lungs: CTA bilaterally Heart: no MCR  Assessment and plan: 60 y.o. female with routine risk for colon cancer  Screening colonoscoy today  Care is appropriate for the ambulatory setting.  Owens Loffler, MD [facility] 07/01/2021, 8:25 AM

## 2021-07-01 NOTE — Patient Instructions (Signed)
Handout on polyps given. ° °YOU HAD AN ENDOSCOPIC PROCEDURE TODAY AT THE [facility]:   Refer to the procedure report that was given to you for any specific questions about what was found during the examination.  If the procedure report does not answer your questions, please call your gastroenterologist to clarify.  If you requested that your care partner not be given the details of your procedure findings, then the procedure report has been included in a sealed envelope for you to review at your convenience later. ° °YOU SHOULD EXPECT: Some feelings of bloating in the abdomen. Passage of more gas than usual.  Walking can help get rid of the air that was put into your GI tract during the procedure and reduce the bloating. If you had a lower endoscopy (such as a colonoscopy or flexible sigmoidoscopy) you may notice spotting of blood in your stool or on the toilet paper. If you underwent a bowel prep for your procedure, you may not have a normal bowel movement for a few days. ° °Please Note:  You might notice some irritation and congestion in your nose or some drainage.  This is from the oxygen used during your procedure.  There is no need for concern and it should clear up in a day or so. ° °SYMPTOMS TO REPORT IMMEDIATELY: ° °Following lower endoscopy (colonoscopy or flexible sigmoidoscopy): ° Excessive amounts of blood in the stool ° Significant tenderness or worsening of abdominal pains ° Swelling of the abdomen that is new, acute ° Fever of 100°F or higher ° °For urgent or emergent issues, a gastroenterologist can be reached at any hour by calling [phone]. °Do not use MyChart messaging for urgent concerns.  ° ° °DIET:  We do recommend a small meal at first, but then you may proceed to your regular diet.  Drink plenty of fluids but you should avoid alcoholic beverages for 24 hours. ° °ACTIVITY:  You should plan to take it easy for the rest of today and you should NOT DRIVE or use heavy machinery  until tomorrow (because of the sedation medicines used during the test).   ° °FOLLOW UP: °Our staff will call the number listed on your records 48-72 hours following your procedure to check on you and address any questions or concerns that you may have regarding the information given to you following your procedure. If we do not reach you, we will leave a message.  We will attempt to reach you two times.  During this call, we will ask if you have developed any symptoms of COVID 19. If you develop any symptoms (ie: fever, flu-like symptoms, shortness of breath, cough etc.) before then, please call [phone].  If you test positive for Covid 19 in the 2 weeks post procedure, please call and report this information to us.   ° °If any biopsies were taken you will be contacted by phone or by letter within the next 1-3 weeks.  Please call us at [phone] if you have not heard about the biopsies in 3 weeks.  ° ° °SIGNATURES/CONFIDENTIALITY: °You and/or your care partner have signed paperwork which will be entered into your electronic medical record.  These signatures attest to the fact that that the information above on your After Visit Summary has been reviewed and is understood.  Full responsibility of the confidentiality of this discharge information lies with you and/or your care-partner.  °

## 2021-07-01 NOTE — Progress Notes (Signed)
Called to room to assist during endoscopic procedure.  Patient ID and intended procedure confirmed with present staff. Received instructions for my participation in the procedure from the performing physician.  

## 2021-07-03 ENCOUNTER — Encounter: Payer: Self-pay | Admitting: Gastroenterology

## 2021-07-03 ENCOUNTER — Telehealth: Payer: Self-pay

## 2021-07-03 NOTE — Telephone Encounter (Signed)
  Follow up Call-  Call back number 07/01/2021  Post procedure Call Back phone  # [phone]  Permission to leave phone message Yes  Some recent data might be hidden     Patient questions:  Do you have a fever, pain , or abdominal swelling? No. Pain Score  0 *  Have you tolerated food without any problems? Yes.    Have you been able to return to your normal activities? Yes.    Do you have any questions about your discharge instructions: Diet   No. Medications  No. Follow up visit  No.  Do you have questions or concerns about your Care? No.  Actions: * If pain score is 4 or above: No action needed, pain <4.  Have you developed a fever since your procedure? no  2.   Have you had an respiratory symptoms (SOB or cough) since your procedure? no  3.   Have you tested positive for COVID 19 since your procedure no  4.   Have you had any family members/close contacts diagnosed with the COVID 19 since your procedure?  no   If yes to any of these questions please route to Joylene John, RN and Joella Prince, RN

## 2021-07-20 ENCOUNTER — Ambulatory Visit: Payer: Managed Care, Other (non HMO) | Admitting: Internal Medicine

## 2021-07-20 ENCOUNTER — Encounter: Payer: Self-pay | Admitting: Internal Medicine

## 2021-07-20 ENCOUNTER — Other Ambulatory Visit: Payer: Self-pay

## 2021-07-20 VITALS — BP 108/82 | HR 67 | Temp 97.8°F | Resp 16 | Ht 63.0 in | Wt 130.0 lb

## 2021-07-20 DIAGNOSIS — E785 Hyperlipidemia, unspecified: Secondary | ICD-10-CM

## 2021-07-20 DIAGNOSIS — Z23 Encounter for immunization: Secondary | ICD-10-CM | POA: Diagnosis not present

## 2021-07-20 DIAGNOSIS — R739 Hyperglycemia, unspecified: Secondary | ICD-10-CM | POA: Diagnosis not present

## 2021-07-20 LAB — CBC WITH DIFFERENTIAL/PLATELET
Basophils Absolute: 0 10*3/uL (ref 0.0–0.1)
Basophils Relative: 0.5 % (ref 0.0–3.0)
Eosinophils Absolute: 0.2 10*3/uL (ref 0.0–0.7)
Eosinophils Relative: 3.4 % (ref 0.0–5.0)
HCT: 38.9 % (ref 36.0–46.0)
Hemoglobin: 12.9 g/dL (ref 12.0–15.0)
Lymphocytes Relative: 38 % (ref 12.0–46.0)
Lymphs Abs: 1.7 10*3/uL (ref 0.7–4.0)
MCHC: 33.2 g/dL (ref 30.0–36.0)
MCV: 91.4 fl (ref 78.0–100.0)
Monocytes Absolute: 0.3 10*3/uL (ref 0.1–1.0)
Monocytes Relative: 7.7 % (ref 3.0–12.0)
Neutro Abs: 2.2 10*3/uL (ref 1.4–7.7)
Neutrophils Relative %: 50.4 % (ref 43.0–77.0)
Platelets: 217 10*3/uL (ref 150.0–400.0)
RBC: 4.26 Mil/uL (ref 3.87–5.11)
RDW: 13.2 % (ref 11.5–15.5)
WBC: 4.4 10*3/uL (ref 4.0–10.5)

## 2021-07-20 LAB — LIPID PANEL
Cholesterol: 213 mg/dL — ABNORMAL HIGH (ref 0–200)
HDL: 69.4 mg/dL (ref >39.00)
LDL Cholesterol: 133 mg/dL — ABNORMAL HIGH (ref 0–99)
NonHDL: 144.02
Total CHOL/HDL Ratio: 3
Triglycerides: 56 mg/dL (ref 0.0–149.0)
VLDL: 11.2 mg/dL (ref 0.0–40.0)

## 2021-07-20 LAB — BASIC METABOLIC PANEL
BUN: 16 mg/dL (ref 6–23)
CO2: 28 mEq/L (ref 19–32)
Calcium: 9.7 mg/dL (ref 8.4–10.5)
Chloride: 105 mEq/L (ref 96–112)
Creatinine, Ser: 0.77 mg/dL (ref 0.40–1.20)
GFR: 84.4 mL/min (ref >60.00)
Glucose, Bld: 84 mg/dL (ref 70–99)
Potassium: 4.4 mEq/L (ref 3.5–5.1)
Sodium: 141 mEq/L (ref 135–145)

## 2021-07-20 NOTE — Patient Instructions (Addendum)
Vaccines are recommended: COVID-vaccine booster Flu shot       GO TO THE LAB : Get the blood work     [place], [place] back for your second shingles shot in 2 to 4 months  Come back for a checkup in 1 year      "Living will", "[place] of attorney": Advanced care planning  (If you already have a living will or healthcare power of attorney, please bring the copy to be scanned in your chart.)  Advance care planning is a process that supports adults in  understanding and sharing their preferences regarding future medical care.   The patient's preferences are recorded in documents called Advance Directives.    Advanced directives are completed (and can be modified at any time) while the patient is in full mental capacity.   The documentation should be available at all times to the patient, the family and the healthcare providers.  Bring in a copy to be scanned in your chart is an excellent idea and is recommended   This legal documents direct treatment decision making and/or appoint a surrogate to make the decision if the patient is not capable to do so.    Advance directives can be documented in many types of formats,  documents have names such as:  Lliving will  Durable power of attorney for healthcare (healthcare proxy or healthcare power of attorney)  Combined directives  Physician orders for life-sustaining treatment    More information at:  [email or web address]

## 2021-07-20 NOTE — Assessment & Plan Note (Signed)
GERD: Not an issue at this point Dyslipidemia: Diet controlled, check labs. DJD: Sees rheumatology regularly Preventive care reviewed RTC 1 year

## 2021-07-20 NOTE — Assessment & Plan Note (Signed)
Declines physical, preventive care reviewed. - TD 2015 -shingrix d/w pt, first dose today, next in 2 to 4 months -[place] x3, recommend booster. - flu shot q year rec   -Female care: per gyn -[facility]: Colonoscopy 02-2011, cscope 07-01-21, next  per GI. - POA discussed

## 2021-07-20 NOTE — Progress Notes (Signed)
Subjective:    Patient ID: Heather Collins, female    DOB: 08/24/1961, 60 y.o.   MRN: [id number]  DOS:  07/20/2021 Type of visit - description: ROV  Since the last office visit she is doing well and has no major concerns  Review of Systems  Denies chest pain or difficulty breathing. 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 Cresenciano Lick, with a 4.0 mm titanium Quentin Cornwall prosthesis with a large well and narrow shaft over a vein graft   STAPEDECTOMY Right 2008   UPPER GASTROINTESTINAL ENDOSCOPY      Social History   Socioeconomic History   Marital status: Married    Spouse name: Not on file   Number of children: 1   Years of education: Not on file   Highest education level: Not on file  Occupational History   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  Tobacco Use   Smoking status: Former    Packs/day: 0.25    Years: 4.00    Pack years: 1.00    Types: Cigarettes    Quit date: 11/27/1993    Years since quitting: 27.6   Smokeless tobacco: Never   Tobacco comments:    quit in the 90s  Vaping Use   Vaping Use: Never used  Substance and Sexual Activity   Alcohol use: No    Drug use: Never   Sexual activity: Not on file  Other Topics Concern   Not on file  Social History Narrative   Son, 1997, going to college to graduate 10-2021   Social Determinants of Health   Financial Resource Strain: Not on file  Food Insecurity: Not on file  Transportation Needs: Not on file  Physical Activity: Not on file  Stress: Not on file  Social Connections: Not on file  Intimate Partner Violence: Not on file     Allergies as of 07/20/2021   No Known Allergies      Medication List        Accurate as of July 20, 2021 10:06 AM. If you have any questions, ask your nurse or doctor.          B-12 PO Take by mouth.   CALCIUM PO Take by mouth.   cetirizine 10 MG tablet Commonly known as: ZYRTEC Take 10 mg by mouth daily.   cholecalciferol 1000 units tablet Commonly known as: VITAMIN D Take 1,000 Units by mouth daily.   Florical 8.3-364 MG Caps capsule Generic drug: sodium fluoride-calcium carbonate Florical 3.75 mg (8.25)-145 mg (364) capsule   GINGER PO Take 2 capsules by mouth daily.   GLUCOSAMINE PO Take by mouth.   omega-3 acid ethyl esters 1  g capsule Commonly known as: LOVAZA Take 1 capsule (1 g total) by mouth 2 (two) times daily.   ondansetron 4 MG tablet Commonly known as: ZOFRAN Take 1 tablet (4 mg total) by mouth as directed. 1 table 30-60 minutes prior to each prep dose.   PRILOSEC PO Take by mouth.   TART CHERRY PO Take by mouth.   TURMERIC PO Take 1 tablet by mouth daily.           Objective:   Physical Exam BP 108/82 (BP Location: Left Arm, Patient Position: Sitting, Cuff Size: Small)   Pulse 67   Temp 97.8 F (36.6 C) (Oral)   Resp 16   Ht '5\' 3"'$  (1.6 m)   Wt 130 lb (59 kg)   LMP 12/04/2017 (Approximate)   SpO2 98%   BMI 23.03 kg/m  General: Well developed, NAD, BMI noted Neck: No  thyromegaly  HEENT:  Normocephalic . Face symmetric, atraumatic Lungs:  CTA B Normal respiratory effort, no  intercostal retractions, no accessory muscle use. Heart: RRR,  no murmur.  Abdomen:  Not distended, soft, non-tender. No rebound or rigidity.   Lower extremities: no pretibial edema bilaterally  Skin: Exposed areas without rash. Not pale. Not jaundice Neurologic:  alert & oriented X3.  Speech normal, gait appropriate for age and unassisted Strength symmetric and appropriate for age.  Psych: Cognition and judgment appear intact.  Cooperative with normal attention span and concentration.  Behavior appropriate. No anxious or depressed appearing.     Assessment     Assessment  GERD Dyslipidemia DJD Menopausal, hysterectomy for abnormal Paps 11-2019, cervix pathology acute and chronic cervicitis RLS Migraine headaches HOH , on Florical to prevent calcium deposits in the middle ear EGD 02-2015 wnl, BX negative  PLAN GERD: Not an issue at this point Dyslipidemia: Diet controlled, check labs. DJD: Sees rheumatology regularly Preventive care reviewed RTC 1 year    This visit occurred during the SARS-CoV-2 public health emergency.  Safety protocols were in place, including screening questions prior to the visit, additional usage of staff PPE, and extensive cleaning of exam room while observing appropriate contact time as indicated for disinfecting solutions.

## 2021-09-22 ENCOUNTER — Other Ambulatory Visit: Payer: Self-pay

## 2021-09-22 ENCOUNTER — Ambulatory Visit (INDEPENDENT_AMBULATORY_CARE_PROVIDER_SITE_OTHER): Payer: Managed Care, Other (non HMO)

## 2021-09-22 DIAGNOSIS — Z23 Encounter for immunization: Secondary | ICD-10-CM | POA: Diagnosis not present

## 2021-09-22 NOTE — Progress Notes (Signed)
Heather Collins is a 60 y.o. female presents to the office today for 2nd Shingrix injection, per physician's orders. Original order: 07/20/21- Dr Larose Kells Shingrix 0.5 mL,  IM (route) was administered L Deltoid (location) today. Patient tolerated injection. Patient next injection due: N/A, appt made No  Creft, Kristine Garbe L

## 2021-09-29 LAB — HM PAP SMEAR: HM Pap smear: NEGATIVE

## 2021-10-04 ENCOUNTER — Other Ambulatory Visit: Payer: Self-pay | Admitting: Rheumatology

## 2021-10-05 NOTE — Telephone Encounter (Signed)
Next Visit: 02/23/2022  Last Visit: 02/24/2021  Last Fill: 03/26/2021  Current Dose per office note on 02/24/2021: not discussed   Okay to refill Lovaza?

## 2021-11-05 ENCOUNTER — Other Ambulatory Visit: Payer: Self-pay

## 2021-11-05 ENCOUNTER — Ambulatory Visit
Admission: EM | Admit: 2021-11-05 | Discharge: 2021-11-05 | Disposition: A | Discharge status: Home / Self Care | Payer: Managed Care, Other (non HMO) | Attending: Emergency Medicine | Admitting: Emergency Medicine

## 2021-11-05 DIAGNOSIS — J0101 Acute recurrent maxillary sinusitis: Secondary | ICD-10-CM | POA: Diagnosis not present

## 2021-11-05 MED ORDER — IPRATROPIUM BROMIDE 0.06 % NA SOLN: 2.0000 | Freq: Four times a day (QID) | NASAL | 0 refills | Status: DC

## 2021-11-05 MED ORDER — AMOXICILLIN-POT CLAVULANATE 875-125 MG PO TABS: 1.0000 | ORAL_TABLET | Freq: Two times a day (BID) | ORAL | 0 refills | Status: AC

## 2021-11-05 MED ORDER — IBUPROFEN 400 MG PO TABS: 400.0000 mg | ORAL_TABLET | Freq: Three times a day (TID) | ORAL | 0 refills | Status: DC | PRN

## 2021-11-05 NOTE — ED Triage Notes (Signed)
Pt c/o facial pressure and headache that began last night.

## 2021-11-05 NOTE — Discharge Instructions (Signed)
Please begin Augmentin, 1 tablet twice daily for the next 10 days.  Please begin ibuprofen 400 mg 3 times daily along with Atrovent nasal spray to open sinus passages and allow drainage of purulent fluid.  Thank you for visiting urgent care today, I hope you feel better soon.

## 2021-11-05 NOTE — ED Provider Notes (Signed)
[facility]    CSN: [id number] Arrival date & time: 11/05/21  0947    HISTORY  No chief complaint on file.  HPI Heather Collins is a 60 y.o. female. Pt c/o facial pressure and headache that began last night, patient states that she has pain behind her eyes that radiates down to her eyeteeth.  Patient reports a history of recurrent sinusitis, states she took Advil Cold and Sinus last night and did not get full relief like she usually does, states whenever this happens she knows that she needs an antibiotic.  Patient states she has been prescribed Flonase in the past, states it makes her very jittery when she uses it so she does not like to use it.  She states she works for [employer] at the [employer].  The history is provided by the patient.  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    Otosclerosis involving oval window, nonobliterative    Bilateral   Restless legs syndrome (RLS)    Patient Active Problem List   Diagnosis Date Noted   Abnormal Pap smear of cervix 11/29/2019   Status post total hysterectomy 11/29/2019   Annual physical exam 05/18/2017   PCP NOTES >>>>>>>>>>>>>> 05/18/2017   Contact dermatitis due to poison ivy 07/07/2013   Dizziness and giddiness 05/25/2013   Dyslipidemia 04/11/2012   Left upper quadrant pain 04/11/2012   CARPAL TUNNEL SYNDROME 01/01/2011   RESTLESS LEG SYNDROME 11/25/2007   MIGRAINE HEADACHE 11/25/2007   G E R D 05/31/2007   Past Surgical History:  Procedure Laterality Date   COLONOSCOPY     LIPOMA EXCISION  12/05/2020   x2   ROBOTIC ASSISTED LAPAROSCOPIC HYSTERECTOMY AND SALPINGECTOMY Bilateral 11/29/2019   Procedure: XI ROBOTIC ASSISTED  LAPAROSCOPIC HYSTERECTOMY AND SALPINGECTOMY;  Surgeon: Servando Salina, MD;  Location: [place];  Service: Gynecology;  Laterality: Bilateral;   STAPEDECTOMY Left 2016   Dr. Cresenciano Lick, with a 4.0 mm titanium Quentin Cornwall prosthesis with a large well and narrow shaft over a vein graft   STAPEDECTOMY Right 2008   UPPER GASTROINTESTINAL ENDOSCOPY     OB History   No obstetric history on file.    Home Medications    Prior to Admission medications   Medication Sig Start Date End Date Taking? Authorizing Provider  CALCIUM PO Take by mouth.    [provider]  cetirizine (ZYRTEC) 10 MG tablet Take 10 mg by mouth daily.    [provider]  cholecalciferol (VITAMIN D) 1000 UNITS tablet Take 1,000 Units by mouth daily.    [provider]  Cyanocobalamin (B-12 PO) Take by mouth.    [provider]  Ginger, Zingiber officinalis, (GINGER PO) Take 2 capsules by mouth daily.     [provider]  Glucosamine HCl (GLUCOSAMINE PO) Take by mouth.    [provider]  omega-3 acid ethyl esters (LOVAZA) 1 g capsule TAKE 1 CAPSULE TWICE A DAY 10/05/21   Ofilia Neas, PA-C  Omeprazole Magnesium (PRILOSEC PO) Take by mouth.    [provider]  ondansetron (ZOFRAN) 4 MG tablet Take 1 tablet (4 mg total) by mouth as directed. 1 table 30-60 minutes prior to  each prep dose. 06/16/21   Milus Banister, MD  sodium fluoride-calcium carbonate (FLORICAL) 8.3-364 MG CAPS capsule Florical 3.75 mg (8.25)-145 mg (364) capsule    [provider]  TART CHERRY PO Take by mouth.    [provider]  TURMERIC PO Take 1 tablet by mouth daily.     [provider]   Family History Family History  Problem Relation Age of Onset   Heart disease Mother 14       at age 17   Arthritis Mother    Lung cancer Father    Diabetes Sister    Healthy Brother    Healthy Brother    Coronary artery disease Paternal Uncle    Healthy Son     Colon cancer Neg Hx    Breast cancer Neg Hx    Colon polyps Neg Hx    Esophageal cancer Neg Hx    Stomach cancer Neg Hx    Rectal cancer Neg Hx    Social History Social History   Tobacco Use   Smoking status: Former    Packs/day: 0.25    Years: 4.00    Pack years: 1.00    Types: Cigarettes    Quit date: 11/27/1993    Years since quitting: 27.9   Smokeless tobacco: Never   Tobacco comments:    quit in the 90s  Vaping Use   Vaping Use: Never used  Substance Use Topics   Alcohol use: No   Drug use: Never   Allergies   Sulfa antibiotics  Review of Systems Review of Systems Pertinent findings noted in history of present illness.   Physical Exam Triage Vital Signs ED Triage Vitals  Enc Vitals Group     BP 09/04/21 0827 (!) 147/82     Pulse Rate 09/04/21 0827 72     Resp 09/04/21 0827 18     Temp 09/04/21 0827 98.3 F (36.8 C)     Temp Source 09/04/21 0827 Oral     SpO2 09/04/21 0827 98 %     Weight --      Height --      Head Circumference --      Peak Flow --      Pain Score 09/04/21 0826 5     Pain Loc --      Pain Edu? --      Excl. in [place]? --   No data found.  Updated Vital Signs BP 102/67 (BP Location: Right Arm)    Pulse 81    Temp 98.8 F (37.1 C) (Oral)    Resp 18    LMP 12/04/2017 (Approximate)    SpO2 98%   Physical Exam Vitals and nursing note reviewed.  Constitutional:      General: She is not in acute distress.    Appearance: Normal appearance. She is not ill-appearing.  HENT:     Head: Normocephalic and atraumatic.     Salivary Glands: Right salivary gland is not diffusely enlarged or tender. Left salivary gland is not diffusely enlarged or tender.     Right Ear: Ear canal and external ear normal. No drainage. No middle ear effusion. There is no impacted cerumen. Tympanic membrane is bulging. Tympanic membrane is not erythematous.     Left Ear: Ear canal and external ear normal. No drainage.  No middle ear effusion. There is no impacted  cerumen. Tympanic membrane is bulging. Tympanic membrane is not erythematous.     Ears:     Comments:  Both TMs bulging with milky fluid    Nose: Mucosal edema, congestion and rhinorrhea present. No nasal deformity or septal deviation. Rhinorrhea is clear.     Right Turbinates: Enlarged and swollen. Not pale.     Left Turbinates: Enlarged and swollen. Not pale.     Right Sinus: Maxillary sinus tenderness present. No frontal sinus tenderness.     Left Sinus: Maxillary sinus tenderness present. No frontal sinus tenderness.     Mouth/Throat:     Lips: Pink. No lesions.     Mouth: Mucous membranes are moist. No oral lesions.     Pharynx: Oropharynx is clear. Uvula midline. No posterior oropharyngeal erythema or uvula swelling.     Tonsils: No tonsillar exudate. 0 on the right. 0 on the left.  Eyes:     General: Lids are normal.        Right eye: No discharge.        Left eye: No discharge.     Extraocular Movements: Extraocular movements intact.     Conjunctiva/sclera: Conjunctivae normal.     Right eye: Right conjunctiva is not injected.     Left eye: Left conjunctiva is not injected.  Neck:     Trachea: Trachea and phonation normal.  Cardiovascular:     Rate and Rhythm: Normal rate and regular rhythm.     Pulses: Normal pulses.     Heart sounds: Normal heart sounds. No murmur heard.   No friction rub. No gallop.  Pulmonary:     Effort: Pulmonary effort is normal. No accessory muscle usage, prolonged expiration or respiratory distress.     Breath sounds: Normal breath sounds. No stridor, decreased air movement or transmitted upper airway sounds. No decreased breath sounds, wheezing, rhonchi or rales.  Chest:     Chest wall: No tenderness.  Musculoskeletal:        General: Normal range of motion.     Cervical back: Normal range of motion and neck supple. Normal range of motion.  Lymphadenopathy:     Cervical: Cervical adenopathy present.     Right cervical: Superficial cervical  adenopathy present.     Left cervical: Superficial cervical adenopathy present.  Skin:    General: Skin is warm and dry.     Findings: No erythema or rash.  Neurological:     General: No focal deficit present.     Mental Status: She is alert and oriented to person, place, and time.  Psychiatric:        Mood and Affect: Mood normal.        Behavior: Behavior normal.    Visual Acuity Right Eye Distance:   Left Eye Distance:   Bilateral Distance:    Right Eye Near:   Left Eye Near:    Bilateral Near:     UC Couse / Diagnostics / Procedures:    EKG  Radiology No results found.  Procedures Procedures (including critical care time)  UC Diagnoses / Final Clinical Impressions(s)   I have reviewed the triage vital signs and the nursing notes.  Pertinent labs & imaging results that were available during my care of the patient were reviewed by me and considered in my medical decision making (see chart for details).   Final diagnoses:  Acute recurrent maxillary sinusitis   Recurrent maxillary sinusitis.  Patient has allowed herself 1 full day evaluation prior to requesting antibiotics.  Will provide patient with antibiotics as requested given patient's history.  Patient also advised to continue ibuprofen and to  add Atrovent nasal spray.  ED Prescriptions     Medication Sig Dispense Auth. Provider   amoxicillin-clavulanate (AUGMENTIN) 875-125 MG tablet Take 1 tablet by mouth 2 (two) times daily for 10 days. 20 tablet Lynden Oxford Scales, PA-C   ipratropium (ATROVENT) 0.06 % nasal spray Place 2 sprays into both nostrils 4 (four) times daily. As needed for nasal congestion, runny nose 15 mL Lynden Oxford Scales, PA-C   ibuprofen (ADVIL) 400 MG tablet Take 1 tablet (400 mg total) by mouth every 8 (eight) hours as needed for up to 30 doses. 30 tablet Lynden Oxford Scales, PA-C      PDMP not reviewed this encounter.  Pending results:  Labs Reviewed - No data to  display  Medications Ordered in UC: Medications - No data to display  Disposition Upon Discharge:  Condition: stable for discharge home Home: take medications as prescribed; routine discharge instructions as discussed; follow up as advised.  Patient presented with an acute illness with associated systemic symptoms and significant discomfort requiring urgent management. In my opinion, this is a condition that a prudent lay person (someone who possesses an average knowledge of health and medicine) may potentially expect to result in complications if not addressed urgently such as respiratory distress, impairment of bodily function or dysfunction of bodily organs.   Routine symptom specific, illness specific and/or disease specific instructions were discussed with the patient and/or caregiver at length.   As such, the patient has been evaluated and assessed, work-up was performed and treatment was provided in alignment with urgent care protocols and evidence based medicine.  Patient/parent/caregiver has been advised that the patient may require follow up for further testing and treatment if the symptoms continue in spite of treatment, as clinically indicated and appropriate.  If the patient was tested for COVID-19, Influenza and/or RSV, then the patient/parent/guardian was advised to isolate at home pending the results of his/her diagnostic coronavirus test and potentially longer if theyre positive. I have also advised pt that if his/her COVID-19 test returns positive, it's recommended to self-isolate for at least 10 days after symptoms first appeared AND until fever-free for 24 hours without fever reducer AND other symptoms have improved or resolved. Discussed self-isolation recommendations as well as instructions for household member/close contacts as per the [facility] and [place] DHHS, and also gave patient the [place] packet with this information.  Patient/parent/caregiver has been advised to return to the [facility]  or PCP in 3-5 days if no better; to PCP or the Emergency Department if new signs and symptoms develop, or if the current signs or symptoms continue to change or worsen for further workup, evaluation and treatment as clinically indicated and appropriate  The patient will follow up with their current PCP if and as advised. If the patient does not currently have a PCP we will assist them in obtaining one.   The patient may need specialty follow up if the symptoms continue, in spite of conservative treatment and management, for further workup, evaluation, consultation and treatment as clinically indicated and appropriate.  Patient/parent/caregiver verbalized understanding and agreement of plan as discussed.  All questions were addressed during visit.  Please see discharge instructions below for further details of plan.  Discharge Instructions:   Discharge Instructions      Please begin Augmentin, 1 tablet twice daily for the next 10 days.  Please begin ibuprofen 400 mg 3 times daily along with Atrovent nasal spray to open sinus passages and allow drainage of purulent fluid.  Thank you  for visiting urgent care today, I hope you feel better soon.      This office note has been dictated using Museum/gallery curator.  Unfortunately, and despite my best efforts, this method of dictation can sometimes lead to occasional typographical or grammatical errors.  I apologize in advance if this occurs.     Lynden Oxford Scales, [place] 11/05/21 [phone]

## 2021-11-20 ENCOUNTER — Encounter: Payer: Self-pay | Admitting: Internal Medicine

## 2021-12-07 ENCOUNTER — Telehealth: Payer: Self-pay

## 2021-12-07 NOTE — Telephone Encounter (Signed)
Caller Name [place] Phone Number [phone] Patient Name Heather Collins Patient DOB 05-Nov-1961 Call Type Message Only Information Provided Reason for Call Request to Schedule Office Appointment Initial Comment Caller states she would like to schedule an appt . for this week after 1pm Patient request to speak to RN No Disp. Time Disposition Final User 12/07/2021 12:32:24 PM General Information Provided Yes Janith Lima Call Closed By: Janith Lima Transaction Date/Time: 12/07/2021 12:28:58 PM (ET)

## 2021-12-07 NOTE — Telephone Encounter (Signed)
Appt scheduled 12/08/21 with pcp.

## 2021-12-08 ENCOUNTER — Ambulatory Visit: Payer: Managed Care, Other (non HMO) | Admitting: Internal Medicine

## 2021-12-08 ENCOUNTER — Encounter: Payer: Self-pay | Admitting: Internal Medicine

## 2021-12-08 VITALS — BP 106/72 | HR 67 | Temp 98.2°F | Resp 16 | Ht 63.0 in | Wt 127.5 lb

## 2021-12-08 DIAGNOSIS — R1013 Epigastric pain: Secondary | ICD-10-CM

## 2021-12-08 DIAGNOSIS — R221 Localized swelling, mass and lump, neck: Secondary | ICD-10-CM | POA: Diagnosis not present

## 2021-12-08 DIAGNOSIS — H9202 Otalgia, left ear: Secondary | ICD-10-CM | POA: Diagnosis not present

## 2021-12-08 NOTE — Patient Instructions (Signed)
For left ear ache and allergies: Zyrtec Flonase 2 sprays on each side of the nose daily Ipratropium nasal spray as prescribed Call if not gradually better  Tylenol  500 mg OTC 2 tabs a day every 8 hours as needed for pain   Okay to take ibuprofen only sporadically because  may cause gastritis and ulcers. Take it always with food.  Next If you notice nausea, stomach pain, change in the color of stools --->  Stop the medicine and let us know

## 2021-12-08 NOTE — Progress Notes (Signed)
Subjective:    Patient ID: Heather Collins, female    DOB: [birth year], 61 y.o.   MRN: [id number]  DOS:  12/08/2021 Type of visit - description: acute  Went to the urgent care back in December, Dx with sinusitis and a ear infection. After Augmentin and nasal lavages she felt better.  She is having upper respiratory symptoms again for the last week: She felt the L side of the neck was swollen however on further questioning she felt no lump and when she look  her neck on  the mirror there was no visible swelling. She also have some left ear ache.  No discharge. Because of above sxs she started to take ibuprofen 400 mg every 6 hours. Sxs improved.  At the same time she developed some epigastric discomfort / burning.  Review of Systems  No fever chills Has allergies, occasional nasal congestion and postnasal dripping. She denies any acid reflux per se, no blood in the stools or change in the color of the stools.   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 Cresenciano Lick, with a 4.0 mm titanium Quentin Cornwall prosthesis with a large well and narrow shaft over a vein graft   STAPEDECTOMY Right 2008    UPPER GASTROINTESTINAL ENDOSCOPY      Current Outpatient Medications  Medication Instructions   CALCIUM PO Oral   cetirizine (ZYRTEC) 10 mg, Oral, Daily   cholecalciferol (VITAMIN D) 1,000 Units, Oral, Daily   COLLAGEN-VITAMIN C-BIOTIN PO Oral   Cyanocobalamin (B-12 PO) Oral   Ginger, Zingiber officinalis, (GINGER PO) 2 capsules, Oral, Daily   Glucosamine HCl (GLUCOSAMINE PO) Oral   ibuprofen (ADVIL) 400 mg, Oral, Every 8 hours PRN   ipratropium (ATROVENT) 0.06 % nasal spray 2 sprays, Each Nare, 4 times daily, As needed for nasal congestion, runny nose   Multiple Vitamins-Minerals (ICAPS AREDS 2 PO) Oral   omega-3 acid ethyl esters (LOVAZA) 1 g capsule TAKE 1 CAPSULE TWICE A DAY   Omeprazole Magnesium (PRILOSEC PO) Oral   POTASSIUM PO Oral   sodium fluoride-calcium carbonate (FLORICAL) 8.3-364 MG CAPS capsule Florical 3.75 mg (8.25)-145 mg (364) capsule   TART CHERRY PO Oral   TURMERIC PO 1 tablet, Oral, Daily       Objective:   Physical Exam BP 106/72 (BP Location: Left Arm, Patient Position: Sitting, Cuff Size: Small)    Pulse 67    Temp 98.2 F (36.8 C) (Oral)    Resp 16    Ht 5\' 3"  (1.6 m)    Wt 127 lb 8 oz (57.8 kg)    LMP 12/04/2017 (Approximate)    SpO2 97%    BMI 22.59 kg/m  General:   Well  developed, NAD, BMI noted. HEENT:  Normocephalic . Face symmetric, atraumatic Throat: Symmetric, not red.  Oral members seems normal. Ears: TMs are slightly bulged, more noticeable on the left.  No redness, canal normal. Neck: No thyromegaly, no lymphadenopathies, no asymmetry or swelling. TMJ: She has a click but no pain. Abdomen: Soft, nontender  neurologic:  alert & oriented X3.  Speech normal, gait appropriate for age and unassisted Psych--  Cognition and judgment appear intact.  Cooperative with normal attention span and concentration.  Behavior appropriate. No anxious or depressed appearing.      Assessment      Assessment  GERD Dyslipidemia DJD Menopausal,  hysterectomy for abnormal Paps 11-2019, cervix pathology acute and chronic cervicitis RLS Migraine headaches HOH , on Florical to prevent calcium deposits in the middle ear EGD 02-2015 wnl, BX negative  PLAN Neck swelling?  Exam is benign, offered ENT referral, she declined, recommend to call if she still feel swelling in that area. Allergies, left ear ache: Suspect L ear ache is possibly serous otitis.  No evidence of acute infection.   Continue Zyrtec, Flonase and start ipratropium nasal spray. Epigastric pain: Started after high doses of ibuprofen, recommend to stop,use  Tylenol instead.  If she ever takes ibuprofen recommend to take only sporadically and with GI precautions, see AVS.  She takes omeprazole OTC daily. Call if epigastric pain is not improving  This visit occurred during the SARS-CoV-2 public health emergency.  Safety protocols were in place, including screening questions prior to the visit, additional usage of staff PPE, and extensive cleaning of exam room while observing appropriate contact time as indicated for disinfecting solutions.

## 2021-12-08 NOTE — Assessment & Plan Note (Signed)
Neck swelling?  Exam is benign, offered ENT referral, she declined, recommend to call if she still feel swelling in that area. Allergies, left ear ache: Suspect L ear ache is possibly serous otitis.  No evidence of acute infection.   Continue Zyrtec, Flonase and start ipratropium nasal spray. Epigastric pain: Started after high doses of ibuprofen, recommend to stop,use  Tylenol instead.  If she ever takes ibuprofen recommend to take only sporadically and with GI precautions, see AVS.  She takes omeprazole OTC daily. Call if epigastric pain is not improving

## 2022-01-04 ENCOUNTER — Ambulatory Visit: Payer: Managed Care, Other (non HMO) | Admitting: Internal Medicine

## 2022-01-14 ENCOUNTER — Encounter: Payer: Self-pay | Admitting: Internal Medicine

## 2022-01-18 ENCOUNTER — Encounter: Payer: Self-pay | Admitting: Family Medicine

## 2022-01-18 ENCOUNTER — Ambulatory Visit: Payer: Managed Care, Other (non HMO) | Admitting: Family Medicine

## 2022-01-18 VITALS — BP 108/65 | HR 78 | Ht 63.0 in | Wt 123.4 lb

## 2022-01-18 DIAGNOSIS — H579 Unspecified disorder of eye and adnexa: Secondary | ICD-10-CM | POA: Diagnosis not present

## 2022-01-18 NOTE — Progress Notes (Signed)
Acute Office Visit  Subjective:    Patient ID: Heather Collins, female    DOB: Dec 15, 1960, 61 y.o.   MRN: [id number]  CC: tingling around eyes   HPI Patient is in today for tingling around eyes.   Patient states she started having "funny sensations" to her right eye about 6 months ago and has had normal eye exams since this. Reports that about 2-3 months ago she started having a tingling sensation around her right eyebrow that feels like something is crawling over her eyebrow, around her eye. One eye specialist suggested possible internal shingles d/t recent shingles vaccine and gave her Valtrex, but this did not help at all and she is now having the same sensation around left eyebrow as well. She denies any vision changes, pain, tearing, watering, itching, redness, rashes. She will occasionally feel a wet sensation behind her eyes. No new skin care changes. She has been getting her eyebrows waxed a few times over the past several months, but otherwise nothing new. Reports minimal caffeine intake - 1 cup of coffee and 1 cup of sweet tea per day, no twitching, headaches, weakness, asymmetry, etc.     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 Cresenciano Lick, with a 4.0 mm titanium Quentin Cornwall prosthesis with a large well and narrow shaft over a vein graft   STAPEDECTOMY Right 2008   UPPER GASTROINTESTINAL ENDOSCOPY      Family History  Problem Relation Age of Onset   Heart disease Mother 21       at age 61   Arthritis Mother    Lung cancer Father    Diabetes Sister    Healthy Brother    Healthy Brother    Coronary artery disease Paternal Uncle    Healthy Son    Colon cancer Neg Hx    Breast cancer Neg Hx    Colon polyps Neg Hx    Esophageal cancer Neg Hx    Stomach cancer Neg Hx    Rectal cancer Neg Hx     Social History   Socioeconomic History   Marital status: Married    Spouse name: Not on file   Number of children: 1   Years of education: Not on file   Highest education level: Not on file  Occupational History   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 [employer]  Tobacco Use   Smoking status: Former    Packs/day: 0.25    Years: 4.00    Pack years: 1.00    Types: Cigarettes    Quit date: 11/27/1993    Years since quitting: 28.1   Smokeless tobacco: Never   Tobacco comments:    quit in the 90s  Vaping Use   Vaping Use: Never used  Substance and Sexual Activity   Alcohol use: No   Drug use: Never   Sexual activity: Not on file  Other Topics Concern   Not on file  Social History Narrative   Son, 1997, going to college to graduate 10-2021   Social Determinants of Health   Financial Resource Strain: Not on file  Food Insecurity: Not on file  Transportation Needs: Not on file  Physical Activity: Not on file  Stress: Not on file  Social Connections: Not on file  Intimate Partner Violence: Not on file    Outpatient Medications Prior to Visit  Medication Sig Dispense Refill   CALCIUM PO Take by mouth.     cetirizine (ZYRTEC) 10 MG tablet Take 10 mg by mouth daily.     cholecalciferol (VITAMIN D) 1000 UNITS tablet Take 1,000 Units by mouth daily.     Cyanocobalamin  (B-12 PO) Take by mouth.     Ginger, Zingiber officinalis, (GINGER PO) Take 2 capsules by mouth daily.      Glucosamine HCl (GLUCOSAMINE PO) Take by mouth.     ipratropium (ATROVENT) 0.06 % nasal spray Place 2 sprays into both nostrils 4 (four) times daily. As needed for nasal congestion, runny nose (Patient not taking: Reported on 12/08/2021) 15 mL 0   Multiple Vitamins-Minerals (ICAPS AREDS 2 PO) Take by mouth.     omega-3 acid ethyl esters (LOVAZA) 1 g capsule TAKE 1 CAPSULE TWICE A DAY 180 capsule 1   Omeprazole Magnesium (PRILOSEC PO) Take by mouth.     POTASSIUM PO Take by mouth.     sodium fluoride-calcium carbonate (FLORICAL) 8.3-364 MG CAPS capsule Florical 3.75 mg (8.25)-145 mg (364) capsule     TART CHERRY PO Take by mouth.     TURMERIC PO Take 1 tablet by mouth daily.      COLLAGEN-VITAMIN C-BIOTIN PO Take by mouth.     ibuprofen (ADVIL) 400 MG tablet Take 1 tablet (400 mg total) by mouth every 8 (eight) hours as needed for up to 30 doses. (Patient not taking: Reported on 12/08/2021) 30 tablet 0   No facility-administered medications prior to visit.    Allergies  Allergen Reactions   Sulfa Antibiotics     Review of Systems All review of systems negative except what is listed in the HPI     Objective:    Physical Exam Physical Exam Vitals reviewed.  Constitutional:      Appearance: Normal appearance. She is normal weight.  HENT:     Head: Normocephalic and atraumatic.  Eyes:     General:        Right eye: No discharge.        Left eye: No discharge.     Extraocular Movements: Extraocular movements intact.     Conjunctiva/sclera: Conjunctivae normal.     Pupils: Pupils are equal, round, and reactive to light.  Skin:    General: Skin is warm and dry.  Neurological:     General: No focal deficit present.     Mental Status: She is alert and oriented to person, place, and time. Mental status is at baseline.     Cranial Nerves: No cranial nerve deficit.     Sensory:  No sensory deficit.     Motor: No weakness.     Coordination: Coordination normal.     Comments: NIHSS = 0  Psychiatric:        Mood and Affect: Mood normal.  Behavior: Behavior normal.        Thought Content: Thought content normal.        Judgment: Judgment normal.    BP 108/65    Pulse 78    Ht '5\' 3"'$  (1.6 m)    Wt 123 lb 6.4 oz (56 kg)    LMP 12/04/2017 (Approximate)    BMI 21.86 kg/m  Wt Readings from Last 3 Encounters:  01/18/22 123 lb 6.4 oz (56 kg)  12/08/21 127 lb 8 oz (57.8 kg)  07/20/21 130 lb (59 kg)    Health Maintenance Due  Topic Date Due   COVID-19 Vaccine (4 - Booster for Pfizer series) 10/31/2020   MAMMOGRAM  01/13/2022    There are no preventive care reminders to display for this patient.   Lab Results  Component Value Date   TSH 2.31 06/25/2019   Lab Results  Component Value Date   WBC 4.4 07/20/2021   HGB 12.9 07/20/2021   HCT 38.9 07/20/2021   MCV 91.4 07/20/2021   PLT 217.0 07/20/2021   Lab Results  Component Value Date   NA 141 07/20/2021   K 4.4 07/20/2021   CO2 28 07/20/2021   GLUCOSE 84 07/20/2021   BUN 16 07/20/2021   CREATININE 0.77 07/20/2021   BILITOT 0.6 07/21/2020   ALKPHOS 64 06/25/2019   AST 18 07/21/2020   ALT 13 07/21/2020   PROT 6.5 07/21/2020   ALBUMIN 4.4 06/25/2019   CALCIUM 9.7 07/20/2021   ANIONGAP 7 11/30/2019   GFR 84.40 07/20/2021   Lab Results  Component Value Date   CHOL 213 (H) 07/20/2021   Lab Results  Component Value Date   HDL 69.40 07/20/2021   Lab Results  Component Value Date   LDLCALC 133 (H) 07/20/2021   Lab Results  Component Value Date   TRIG 56.0 07/20/2021   Lab Results  Component Value Date   CHOLHDL 3 07/20/2021   Lab Results  Component Value Date   HGBA1C 5.4 07/21/2020       Assessment & Plan:   1. Abnormal sensation of eye Normal neuro exam of cranial nerves today (NIHSS = 0). No visible abnormalities noted. Discussed options with patient and she would like to  watch it for another month or so - if not improving, refer to neurology for further evaluation/management. Patient aware of signs/symptoms requiring further/urgent evaluation.   Please contact office for follow-up if symptoms do not improve or worsen. Seek emergency care if symptoms become severe.   Terrilyn Saver, NP

## 2022-01-18 NOTE — Patient Instructions (Signed)
Normal neuro exam today.  ?Continue to monitor and watch for any new symptoms.  ?If not improving in the next several weeks, or if new symptoms develop, we can go to neuro for their input.  ?

## 2022-02-08 ENCOUNTER — Telehealth: Payer: Self-pay | Admitting: Family Medicine

## 2022-02-08 ENCOUNTER — Telehealth: Payer: Self-pay | Admitting: Internal Medicine

## 2022-02-08 DIAGNOSIS — H579 Unspecified disorder of eye and adnexa: Secondary | ICD-10-CM

## 2022-02-08 NOTE — Telephone Encounter (Signed)
error 

## 2022-02-08 NOTE — Telephone Encounter (Signed)
Pt stated she was advised by Lovena Le if her eyes were still bothering her to call and a referral to a neurologist would be placed. Please advise.  ?

## 2022-02-08 NOTE — Telephone Encounter (Signed)
Referral placed.

## 2022-02-10 NOTE — Progress Notes (Signed)
? ?Office Visit Note ? ?Patient: Heather Collins             ?Date of Birth: 11/17/1960           ?MRN: [id number]             ?PCP: Colon Branch, MD ?Referring: Colon Branch, MD ?Visit Date: 02/23/2022 ?Occupation: '@GUAROCC'$ @ ? ?Subjective:  ?Stiffness in hands and feet ? ?History of Present Illness: Heather Collins is a 61 y.o. female with history of osteoarthritis side.  She states she continues to have some stiffness in her hands and feet after doing certain activities.  She has not noticed any increased swelling.  She also got some ring splints which has been helpful.  She enjoys gardening which is hard on her hands.  She states that carpal tunnel syndrome symptoms have resolved.  None of the other joints are painful. ? ?Activities of Daily Living:  ?Patient reports morning stiffness for 0 minutes.   ?Patient Denies nocturnal pain.  ?Difficulty dressing/grooming: Denies ?Difficulty climbing stairs: Denies ?Difficulty getting out of chair: Denies ?Difficulty using hands for taps, buttons, cutlery, and/or writing: Denies ? ?Review of Systems  ?Constitutional:  Negative for fatigue.  ?HENT:  Negative for mouth sores, mouth dryness and nose dryness.   ?Eyes:  Negative for pain, itching and dryness.  ?Respiratory:  Negative for shortness of breath and difficulty breathing.   ?Cardiovascular:  Negative for chest pain and palpitations.  ?Gastrointestinal:  Negative for blood in stool, constipation and diarrhea.  ?Endocrine: Negative for increased urination.  ?Genitourinary:  Negative for difficulty urinating.  ?Musculoskeletal:  Negative for joint pain, joint pain, joint swelling, myalgias, morning stiffness, muscle tenderness and myalgias.  ?Skin:  Negative for color change, rash and redness.  ?Allergic/Immunologic: Negative for susceptible to infections.  ?Neurological:  Negative for dizziness, numbness, headaches, memory loss and weakness.  ?Hematological:  Negative for bruising/bleeding tendency.  ?Psychiatric/Behavioral:   Negative for confusion.   ? ?PMFS History:  ?Patient Active Problem List  ? Diagnosis Date Noted  ? Abnormal Pap smear of cervix 11/29/2019  ? Status post total hysterectomy 11/29/2019  ? Annual physical exam 05/18/2017  ? PCP NOTES >>>>>>>>>>>>>> 05/18/2017  ? Contact dermatitis due to poison ivy 07/07/2013  ? Dizziness and giddiness 05/25/2013  ? Dyslipidemia 04/11/2012  ? Left upper quadrant pain 04/11/2012  ? CARPAL TUNNEL SYNDROME 01/01/2011  ? RESTLESS LEG SYNDROME 11/25/2007  ? MIGRAINE HEADACHE 11/25/2007  ? G E R D 05/31/2007  ?  ?Past Medical History:  ?Diagnosis Date  ? Arthritis   ? Carpal tunnel syndrome   ? Esophageal reflux   ? Hiatal hernia   ? Migraine, unspecified, without mention of intractable migraine without mention of status migrainosus   ? 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 Otosclerosis involving oval window, nonobliterative   ? Bilateral  ? Restless legs syndrome (RLS)   ?  ?Family History  ?Problem Relation Age of Onset  ? Heart disease Mother 39  ?     at age 59  ? Arthritis Mother   ? Lung cancer Father   ? Diabetes Sister   ? Healthy Brother   ? Healthy Brother   ? Coronary artery disease Paternal Uncle   ? Healthy Son   ? Colon cancer Neg Hx   ? Breast cancer Neg Hx   ? Colon polyps Neg Hx   ? Esophageal cancer Neg Hx   ?  Stomach cancer Neg Hx   ? Rectal cancer Neg Hx   ? ?Past Surgical History:  ?Procedure Laterality Date  ? COLONOSCOPY    ? LIPOMA EXCISION  12/05/2020  ? x2  ? ROBOTIC ASSISTED LAPAROSCOPIC HYSTERECTOMY AND SALPINGECTOMY Bilateral 11/29/2019  ? Procedure: XI ROBOTIC ASSISTED LAPAROSCOPIC HYSTERECTOMY AND SALPINGECTOMY;  Surgeon: Servando Salina, MD;  Location: [place];  Service: Gynecology;  Laterality: Bilateral;  ? STAPEDECTOMY Left 2016  ? Dr. Cresenciano Lick, with a 4.0 mm titanium Quentin Cornwall prosthesis with a large well and narrow shaft over a vein graft  ? STAPEDECTOMY Right  2008  ? UPPER GASTROINTESTINAL ENDOSCOPY    ? ?Social History  ? ?Social History Narrative  ? Son, 1997, going to college to graduate 10-2021  ? ?Immunization History  ?Administered Date(s) Administered  ? DTaP 04/04/2013  ? Influenza Whole 09/11/2012  ? Influenza-Unspecified 08/08/2016, 08/03/2017, 08/07/2019, 08/18/2021  ? PFIZER(Purple Top)SARS-COV-2 Vaccination 12/26/2019, 01/18/2020, 09/05/2020  ? Td 11/08/2013  ? Zoster Recombinat (Shingrix) 07/20/2021, 09/22/2021  ?  ? ?Objective: ?Vital Signs: BP 100/63 (BP Location: Left Arm, Patient Position: Sitting, Cuff Size: Normal)   Pulse 67   Ht '5\' 2"'$  (1.575 m)   Wt 129 lb 6.4 oz (58.7 kg)   LMP 12/04/2017 (Approximate)   BMI 23.67 kg/m?   ? ?Physical Exam ?Vitals and nursing note reviewed.  ?Constitutional:   ?   Appearance: She is well-developed.  ?HENT:  ?   Head: Normocephalic and atraumatic.  ?Eyes:  ?   Conjunctiva/sclera: Conjunctivae normal.  ?Cardiovascular:  ?   Rate and Rhythm: Normal rate and regular rhythm.  ?   Heart sounds: Normal heart sounds.  ?Pulmonary:  ?   Effort: Pulmonary effort is normal.  ?   Breath sounds: Normal breath sounds.  ?Abdominal:  ?   General: Bowel sounds are normal.  ?   Palpations: Abdomen is soft.  ?Musculoskeletal:  ?   Cervical back: Normal range of motion.  ?Lymphadenopathy:  ?   Cervical: No cervical adenopathy.  ?Skin: ?   General: Skin is warm and dry.  ?   Capillary Refill: Capillary refill takes less than 2 seconds.  ?Neurological:  ?   Mental Status: She is alert and oriented to person, place, and time.  ?Psychiatric:     ?   Behavior: Behavior normal.  ?  ? ?Musculoskeletal Exam: C-spine thoracic and lumbar spine were in good range of motion.  Shoulder joints, elbow joints, wrist joints, MCPs were in good range of motion.  She had bilateral PIP and DIP thickening with limitation of several of her PIP and DIP joints.  Mucinous cyst was noted on the right first PIP joint.  Hip joints and knee joints in good range  of motion.  There was no tenderness over ankles or MTPs.  Osteoarthritic changes were noted in PIP and DIP joints with no synovitis. ? ?CDAI Exam: ?CDAI Score: -- ?Patient Global: --; Provider Global: -- ?Swollen: --; Tender: -- ?Joint Exam 02/23/2022  ? ?No joint exam has been documented for this visit  ? ?There is currently no information documented on the homunculus. Go to the Rheumatology activity and complete the homunculus joint exam. ? ?Investigation: ?No additional findings. ? ?Imaging: ?No results found. ? ?Recent Labs: ?Lab Results  ?Component Value Date  ? WBC 4.4 07/20/2021  ? HGB 12.9 07/20/2021  ? PLT 217.0 07/20/2021  ? NA 141 07/20/2021  ? K 4.4 07/20/2021  ? CL 105 07/20/2021  ? CO2 28  07/20/2021  ? GLUCOSE 84 07/20/2021  ? BUN 16 07/20/2021  ? CREATININE 0.77 07/20/2021  ? BILITOT 0.6 07/21/2020  ? ALKPHOS 64 06/25/2019  ? AST 18 07/21/2020  ? ALT 13 07/21/2020  ? PROT 6.5 07/21/2020  ? ALBUMIN 4.4 06/25/2019  ? CALCIUM 9.7 07/20/2021  ? GFRAA >60 11/30/2019  ? ? ?Speciality Comments: No specialty comments available. ? ?Procedures:  ?No procedures performed ?Allergies: Sulfa antibiotics  ? ?Assessment / Plan:     ?Visit Diagnoses: Primary osteoarthritis of both hands-she continues to have some stiffness in her hands after doing certain activities.  She states overall the discomfort is manageable.  She has been active and has been doing gardening.  Joint protection and muscle strengthening was discussed.  Supplements were reviewed per patient's request.  She has been using wrist splints which has been helpful.  Carpal tunnel syndrome symptoms have resolved. ? ?Primary osteoarthritis of both feet - she has bilateral first MTP thickening and dorsal spurring.  Exercises for feet were also discussed.  She has lateral spurring below her lateral malleolus.  I advised her to schedule an appointment with a podiatrist for evaluation and orthotics. ? ?History of gastroesophageal reflux (GERD)-she takes  omeprazole as needed. ? ?Dyslipidemia-dietary modifications were discussed. ? ?Hx of migraines ? ?RLS (restless legs syndrome)-she takes magnesium on as needed basis. ? ?Orders: ?No orders of the defined types w

## 2022-02-16 ENCOUNTER — Ambulatory Visit: Payer: Managed Care, Other (non HMO) | Admitting: Internal Medicine

## 2022-02-16 ENCOUNTER — Encounter: Payer: Self-pay | Admitting: Internal Medicine

## 2022-02-16 VITALS — BP 116/64 | HR 70 | Temp 97.9°F | Resp 16 | Ht 63.0 in | Wt 127.1 lb

## 2022-02-16 DIAGNOSIS — R202 Paresthesia of skin: Secondary | ICD-10-CM | POA: Diagnosis not present

## 2022-02-16 NOTE — Progress Notes (Signed)
? ?Subjective:  ? ? Patient ID: Heather Collins, female    DOB: 1960-12-07, 61 y.o.   MRN: [id number] ? ?DOS:  02/16/2022 ?Type of visit - description: acute ? ? Sxs started ~ 09-2021, she felt "something" around the right upper eyelid "like pressure". ?Went to see the optometrist , he exam was negative. ?She continue with symptoms she went to see Dr. Pandora Leiter, ophthalmology, at least 2 times, eye exam was normal, they did a trial with Valtrex apparently without much help. ?They also recommended eyedrops for possible dry eye but that has not helped. ? ?Currently her symptoms continue to be a funny feeling like a "movement" at the upper eyelid and some pressure at the lower eyelid.  See graphic. ? ?Denies fever chills ?Denies sneezing, sinus pain or congestion. ?No cough ?No diplopia ?No headache per se ? ?Review of Systems ?See above  ? ?Past Medical History:  ?Diagnosis Date  ? Arthritis   ? Carpal tunnel syndrome   ? Esophageal reflux   ? Hiatal hernia   ? Migraine, unspecified, without mention of intractable migraine without mention of status migrainosus   ? 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 Otosclerosis involving oval window, nonobliterative   ? Bilateral  ? Restless legs syndrome (RLS)   ? ? ?Past Surgical History:  ?Procedure Laterality Date  ? COLONOSCOPY    ? LIPOMA EXCISION  12/05/2020  ? x2  ? ROBOTIC ASSISTED LAPAROSCOPIC HYSTERECTOMY AND SALPINGECTOMY Bilateral 11/29/2019  ? Procedure: XI ROBOTIC ASSISTED LAPAROSCOPIC HYSTERECTOMY AND SALPINGECTOMY;  Surgeon: Servando Salina, MD;  Location: [facility];  Service: Gynecology;  Laterality: Bilateral;  ? STAPEDECTOMY Left 2016  ? Dr. Cresenciano Lick, with a 4.0 mm titanium Quentin Cornwall prosthesis with a large well and narrow shaft over a vein graft  ? STAPEDECTOMY Right 2008  ? UPPER GASTROINTESTINAL ENDOSCOPY    ? ? ?Current Outpatient Medications  ?Medication Instructions  ?  CALCIUM PO Oral  ? cetirizine (ZYRTEC) 10 mg, Oral, Daily  ? cholecalciferol (VITAMIN D) 1,000 Units, Oral, Daily  ? Cyanocobalamin (B-12 PO) Oral  ? Ginger, Zingiber officinalis, (GINGER PO) 2 capsules, Oral, Daily  ? Glucosamine HCl (GLUCOSAMINE PO) Oral  ? ipratropium (ATROVENT) 0.06 % nasal spray 2 sprays, Each Nare, 4 times daily, As needed for nasal congestion, runny nose  ? Multiple Vitamins-Minerals (ICAPS AREDS 2 PO) Oral  ? omega-3 acid ethyl esters (LOVAZA) 1 g capsule TAKE 1 CAPSULE TWICE A DAY  ? Omeprazole Magnesium (PRILOSEC PO) Oral  ? POTASSIUM PO Oral  ? sodium fluoride-calcium carbonate (FLORICAL) 8.3-364 MG CAPS capsule Florical 3.75 mg (8.25)-145 mg (364) capsule  ? TART CHERRY PO Oral  ? TURMERIC PO 1 tablet, Oral, Daily  ? ? ?   ?Objective:  ? Physical Exam ?HENT:  ?   Head:  ? ? ?BP 116/64 (BP Location: Left Arm, Patient Position: Sitting, Cuff Size: Small)   Pulse 70   Temp 97.9 ?F (36.6 ?C) (Oral)   Resp 16   Ht '5\' 3"'$  (1.6 m)   Wt 127 lb 2 oz (57.7 kg)   LMP 12/04/2017 (Approximate)   SpO2 98%   BMI 22.52 kg/m?  ?General:   ?Well developed, NAD, BMI noted. ?HEENT:  ?Normocephalic . Face symmetric, atraumatic. ?Eyeballs symmetric.  Pupils equal and reactive.  Conjunctiva not red. ?EOMI. ?Lower extremities: no pretibial edema bilaterally  ?Skin: Not pale. Not jaundice ?Neurologic:  ?alert & oriented X3.  ?  Speech normal, gait appropriate for age and unassisted. ?Motor symmetric. ?Psych--  ?Cognition and judgment appear intact.  ?Cooperative with normal attention span and concentration.  ?Behavior appropriate. ?No anxious or depressed appearing.  ? ?   ?Assessment   ? ?  Assessment  ?GERD ?Dyslipidemia ?DJD ?Menopausal, hysterectomy for abnormal Paps 11-2019, cervix pathology acute and chronic cervicitis ?RLS ?Migraine headaches ?HOH , on Florical to prevent calcium deposits in the middle ear ?EGD 02-2015 wnl, BX negative ? ?PLAN ?Paresthesias: ?Having funny feeling around the right eye  for months, has seen optometrist, ophthalmologist x2, exam negative, tried antivirals in case she had some sort of herpetic issue (per patient) but symptoms continue. ?Exam is essentially normal. ?Plan: Refer to neurology (actually patient has already been refer by Lovena Le, our PA) Dx: Paresthesias, trigeminal related?Marland Kitchen ?Neck swelling: See last visit, exam of the neck is normal today.  Patient agreed ? ?This visit occurred during the SARS-CoV-2 public health emergency.  Safety protocols were in place, including screening questions prior to the visit, additional usage of staff PPE, and extensive cleaning of exam room while observing appropriate contact time as indicated for disinfecting solutions.  ? ?

## 2022-02-16 NOTE — Assessment & Plan Note (Signed)
Paresthesias: ?Having funny feeling around the right eye for months, has seen optometrist, ophthalmologist x2, exam negative, tried antivirals in case she had some sort of herpetic issue (per patient) but symptoms continue. ?Exam is essentially normal. ?Plan: Refer to neurology (actually patient has already been refer by Lovena Le, our PA) Dx: Paresthesias, trigeminal related?Marland Kitchen ?Neck swelling: See last visit, exam of the neck is normal today.  Patient agreed ?

## 2022-02-23 ENCOUNTER — Encounter: Payer: Self-pay | Admitting: Rheumatology

## 2022-02-23 ENCOUNTER — Ambulatory Visit: Payer: Managed Care, Other (non HMO) | Admitting: Rheumatology

## 2022-02-23 VITALS — BP 100/63 | HR 67 | Ht 62.0 in | Wt 129.4 lb

## 2022-02-23 DIAGNOSIS — G5603 Carpal tunnel syndrome, bilateral upper limbs: Secondary | ICD-10-CM

## 2022-02-23 DIAGNOSIS — E785 Hyperlipidemia, unspecified: Secondary | ICD-10-CM | POA: Diagnosis not present

## 2022-02-23 DIAGNOSIS — Z8669 Personal history of other diseases of the nervous system and sense organs: Secondary | ICD-10-CM

## 2022-02-23 DIAGNOSIS — M19042 Primary osteoarthritis, left hand: Secondary | ICD-10-CM

## 2022-02-23 DIAGNOSIS — G2581 Restless legs syndrome: Secondary | ICD-10-CM

## 2022-02-23 DIAGNOSIS — M19071 Primary osteoarthritis, right ankle and foot: Secondary | ICD-10-CM

## 2022-02-23 DIAGNOSIS — M19072 Primary osteoarthritis, left ankle and foot: Secondary | ICD-10-CM

## 2022-02-23 DIAGNOSIS — Z8719 Personal history of other diseases of the digestive system: Secondary | ICD-10-CM | POA: Diagnosis not present

## 2022-02-23 DIAGNOSIS — M19041 Primary osteoarthritis, right hand: Secondary | ICD-10-CM | POA: Diagnosis not present

## 2022-03-03 ENCOUNTER — Encounter: Payer: Self-pay | Admitting: Diagnostic Neuroimaging

## 2022-03-03 ENCOUNTER — Ambulatory Visit: Payer: Managed Care, Other (non HMO) | Admitting: Diagnostic Neuroimaging

## 2022-03-03 VITALS — BP 114/78 | HR 68 | Ht 62.0 in | Wt 129.4 lb

## 2022-03-03 DIAGNOSIS — R202 Paresthesia of skin: Secondary | ICD-10-CM | POA: Diagnosis not present

## 2022-03-03 DIAGNOSIS — R200 Anesthesia of skin: Secondary | ICD-10-CM | POA: Diagnosis not present

## 2022-03-03 NOTE — Progress Notes (Signed)
? ?[facility] ? ?PATIENT: Heather Collins ?DOB: 06-30-1961 ? ?REFERRING CLINICIAN: Terrilyn Saver, NP ?HISTORY FROM: patient  ?REASON FOR VISIT: new consult  ? ? ?HISTORICAL ? ?CHIEF COMPLAINT:  ?Chief Complaint  ?Patient presents with  ? Abnormal sensations  ?  Rm 6 New Pt  "have seen eye dr x 2, feels like something is going around my right eye, the lid is trying to close, a film is over my eye, sometimes happens to my left eye"   ? ? ?HISTORY OF PRESENT ILLNESS:  ? ?61 year old female here for evaluation of abnormal sensation around eyes.  Symptoms started in January 2023.  Has intermittent numbness pressure moving sensation around her eyelids lasting for few minutes at a time almost on daily basis.  No pain.  No muscle twitching.  No similar sensations anywhere else.  Has been to eye doctor for evaluation and no specific cause was found. ? ? ?REVIEW OF SYSTEMS: Full 14 system review of systems performed and negative with exception of: as per HPI ? ?ALLERGIES: ?Allergies  ?Allergen Reactions  ? Sulfa Antibiotics   ? ? ?HOME MEDICATIONS: ?Outpatient Medications Prior to Visit  ?Medication Sig Dispense Refill  ? b complex vitamins capsule Take 1 capsule by mouth daily.    ? CALCIUM PO Take by mouth.    ? cetirizine (ZYRTEC) 10 MG tablet Take 10 mg by mouth daily.    ? cholecalciferol (VITAMIN D) 1000 UNITS tablet Take 1,000 Units by mouth daily.    ? Ginger, Zingiber officinalis, (GINGER PO) Take 2 capsules by mouth daily.     ? Glucosamine HCl (GLUCOSAMINE PO) Take by mouth.    ? omega-3 acid ethyl esters (LOVAZA) 1 g capsule TAKE 1 CAPSULE TWICE A DAY 180 capsule 1  ? Omeprazole Magnesium (PRILOSEC PO) Take by mouth.    ? sodium fluoride-calcium carbonate (FLORICAL) 8.3-364 MG CAPS capsule Florical 3.75 mg (8.25)-145 mg (364) capsule    ? TART CHERRY PO Take by mouth.    ? TURMERIC PO Take 1 tablet by mouth daily.     ? ipratropium (ATROVENT) 0.06 % nasal spray Place 2 sprays into both nostrils 4  (four) times daily. As needed for nasal congestion, runny nose (Patient not taking: Reported on 12/08/2021) 15 mL 0  ? Multiple Vitamins-Minerals (ICAPS AREDS 2 PO) Take by mouth.    ? ?No facility-administered medications prior to visit.  ? ? ?PAST MEDICAL HISTORY: ?Past Medical History:  ?Diagnosis Date  ? Arthritis   ? Carpal tunnel syndrome   ? Esophageal reflux   ? Hiatal hernia   ? Migraine, unspecified, without mention of intractable migraine without mention of status migrainosus   ? 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 Otosclerosis involving oval window, nonobliterative   ? Bilateral  ? Restless legs syndrome (RLS)   ? ? ?PAST SURGICAL HISTORY: ?Past Surgical History:  ?Procedure Laterality Date  ? COLONOSCOPY    ? LIPOMA EXCISION  12/05/2020  ? x2  ? ROBOTIC ASSISTED LAPAROSCOPIC HYSTERECTOMY AND SALPINGECTOMY Bilateral 11/29/2019  ? Procedure: XI ROBOTIC ASSISTED LAPAROSCOPIC HYSTERECTOMY AND SALPINGECTOMY;  Surgeon: Servando Salina, MD;  Location: [facility];  Service: Gynecology;  Laterality: Bilateral;  ? STAPEDECTOMY Left 2016  ? Dr. Cresenciano Lick, with a 4.0 mm titanium Quentin Cornwall prosthesis with a large well and narrow shaft over a vein graft  ? STAPEDECTOMY Right 2008  ? UPPER GASTROINTESTINAL ENDOSCOPY    ? ? ?  FAMILY HISTORY: ?Family History  ?Problem Relation Age of Onset  ? Heart disease Mother 8  ?     at age 47  ? Arthritis Mother   ? Lung cancer Father   ? Diabetes Sister   ? Healthy Brother   ? Healthy Brother   ? Coronary artery disease Paternal Uncle   ? Healthy Son   ? Colon cancer Neg Hx   ? Breast cancer Neg Hx   ? Colon polyps Neg Hx   ? Esophageal cancer Neg Hx   ? Stomach cancer Neg Hx   ? Rectal cancer Neg Hx   ? ? ?SOCIAL HISTORY: ?Social History  ? ?Socioeconomic History  ? Marital status: Married  ?  Spouse name: Elta Guadeloupe  ? Number of children: 1  ? Years of education: Not on file  ? Highest education  level: 12th grade  ?Occupational History  ? 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  ?Tobacco Use  ? Smoking status: Former  ?  Packs/day: 0.25  ?  Years: 4.00  ?  Pack years: 1.00  ?  Types: Cigarettes  ?  Quit date: 11/27/1993  ?  Years since quitting: 28.2  ?  Passive exposure: Never  ? Smokeless tobacco: Never  ? Tobacco comments:  ?  quit in the 90s  ?Vaping Use  ? Vaping Use: Never used  ?Substance and Sexual Activity  ? Alcohol use: No  ? Drug use: Never  ? Sexual activity: Not on file  ?Other Topics Concern  ? Not on file  ?Social History Narrative  ? Son, 1997, going to college to graduate 10-2021  ? Lives with husband  ? ?Social Determinants of Health  ? ?Financial Resource Strain: Not on file  ?Food Insecurity: Not on file  ?Transportation Needs: Not on file  ?Physical Activity: Not on file  ?Stress: Not on file  ?Social Connections: Not on file  ?Intimate Partner Violence: Not on file  ? ? ? ?PHYSICAL EXAM ? ?GENERAL EXAM/CONSTITUTIONAL: ?Vitals:  ?Vitals:  ? 03/03/22 0952  ?BP: 114/78  ?Pulse: 68  ?Weight: 129 lb 6.4 oz (58.7 kg)  ?Height: '5\' 2"'$  (1.575 m)  ? ?Body mass index is 23.67 kg/m?. ?Wt Readings from Last 3 Encounters:  ?03/03/22 129 lb 6.4 oz (58.7 kg)  ?02/23/22 129 lb 6.4 oz (58.7 kg)  ?02/16/22 127 lb 2 oz (57.7 kg)  ? ?Patient is in no distress; well developed, nourished and groomed; neck is supple ? ?CARDIOVASCULAR: ?Examination of carotid arteries is normal; no carotid bruits ?Regular rate and rhythm, no murmurs ?Examination of peripheral vascular system by observation and palpation is normal ? ?EYES: ?Ophthalmoscopic exam of optic discs and posterior segments is normal; no papilledema or hemorrhages ?No results found. ? ?MUSCULOSKELETAL: ?Gait, strength, tone, movements noted in Neurologic exam below ? ?NEUROLOGIC: ?MENTAL STATUS:  ?   ? View : No data to display.  ?  ?  ?  ? ?awake, alert, oriented to person, place and time ?recent and remote memory intact ?normal attention and  concentration ?language fluent, comprehension intact, naming intact ?fund of knowledge appropriate ? ?CRANIAL NERVE:  ?2nd - no papilledema on fundoscopic exam ?2nd, 3rd, 4th, 6th - pupils equal and reactive to light, visual fields full to confrontation, extraocular muscles intact, no nystagmus ?5th - facial sensation symmetric ?7th - facial strength symmetric ?8th - hearing intact ?9th - palate elevates symmetrically, uvula midline ?11th - shoulder shrug symmetric ?12th - tongue protrusion midline ? ?MOTOR:  ?normal bulk and  tone, full strength in the BUE, BLE ? ?SENSORY:  ?normal and symmetric to light touch, temperature, vibration ? ?COORDINATION:  ?finger-nose-finger, fine finger movements normal ? ?REFLEXES:  ?deep tendon reflexes present and symmetric ? ?GAIT/STATION:  ?narrow based gait ? ? ? ? ?DIAGNOSTIC DATA (LABS, IMAGING, TESTING) ?- I reviewed patient records, labs, notes, testing and imaging myself where available. ? ?Lab Results  ?Component Value Date  ? WBC 4.4 07/20/2021  ? HGB 12.9 07/20/2021  ? HCT 38.9 07/20/2021  ? MCV 91.4 07/20/2021  ? PLT 217.0 07/20/2021  ? ?   ?Component Value Date/Time  ? NA 141 07/20/2021 0844  ? K 4.4 07/20/2021 0844  ? CL 105 07/20/2021 0844  ? CO2 28 07/20/2021 0844  ? GLUCOSE 84 07/20/2021 0844  ? BUN 16 07/20/2021 0844  ? CREATININE 0.77 07/20/2021 0844  ? CREATININE 0.86 07/21/2020 0837  ? CALCIUM 9.7 07/20/2021 0844  ? PROT 6.5 07/21/2020 0837  ? ALBUMIN 4.4 06/25/2019 0848  ? AST 18 07/21/2020 0837  ? ALT 13 07/21/2020 0837  ? ALKPHOS 64 06/25/2019 0848  ? BILITOT 0.6 07/21/2020 0837  ? GFRNONAA >60 11/30/2019 0525  ? GFRAA >60 11/30/2019 0525  ? ?Lab Results  ?Component Value Date  ? CHOL 213 (H) 07/20/2021  ? HDL 69.40 07/20/2021  ? LDLCALC 133 (H) 07/20/2021  ? TRIG 56.0 07/20/2021  ? CHOLHDL 3 07/20/2021  ? ?Lab Results  ?Component Value Date  ? HGBA1C 5.4 07/21/2020  ? ?Lab Results  ?Component Value Date  ? VFIEPPIR51 659 06/25/2019  ? ?Lab Results   ?Component Value Date  ? TSH 2.31 06/25/2019  ? ? ?04/08/21 CT head  ?Normal appearance of the brain itself. ?  ?No calvarial fracture. ?  ?Opacification of the right division of the sphenoid sinus and a ?right posterior ethmoi

## 2022-03-04 ENCOUNTER — Encounter: Payer: Self-pay | Admitting: Diagnostic Neuroimaging

## 2022-03-10 ENCOUNTER — Ambulatory Visit: Payer: Managed Care, Other (non HMO) | Admitting: Diagnostic Neuroimaging

## 2022-03-11 LAB — HM MAMMOGRAPHY

## 2022-04-06 ENCOUNTER — Telehealth: Payer: Managed Care, Other (non HMO) | Admitting: Physician Assistant

## 2022-04-06 DIAGNOSIS — B9789 Other viral agents as the cause of diseases classified elsewhere: Secondary | ICD-10-CM | POA: Diagnosis not present

## 2022-04-06 DIAGNOSIS — J019 Acute sinusitis, unspecified: Secondary | ICD-10-CM | POA: Diagnosis not present

## 2022-04-06 MED ORDER — IPRATROPIUM BROMIDE 0.03 % NA SOLN: 2.0000 | Freq: Two times a day (BID) | NASAL | 0 refills | Status: DC

## 2022-04-06 NOTE — Progress Notes (Signed)
E-Visit for Sinus Problems  We are sorry that you are not feeling well.  Here is how we plan to help!  Based on what you have shared with me it looks like you have sinusitis.  Sinusitis is inflammation and infection in the sinus cavities of the head.  Based on your presentation I believe you most likely have Acute Viral Sinusitis.This is an infection most likely caused by a virus. There is not specific treatment for viral sinusitis other than to help you with the symptoms until the infection runs its course.  You may use an oral decongestant such as Mucinex D or if you have glaucoma or high blood pressure use plain Mucinex. Saline nasal spray help and can safely be used as often as needed for congestion, I have prescribed: Ipratropium Bromide nasal spray 0.03% 2 sprays in eah nostril 2-3 times a day  Some authorities believe that zinc sprays or the use of Echinacea may shorten the course of your symptoms.  Sinus infections are not as easily transmitted as other respiratory infection, however we still recommend that you avoid close contact with loved ones, especially the very [place] and elderly.  Remember to wash your hands thoroughly throughout the day as this is the number one way to prevent the spread of infection!  Home Care: Only take medications as instructed by your medical team. Do not take these medications with alcohol. A steam or ultrasonic humidifier can help congestion.  You can place a towel over your head and breathe in the steam from hot water coming from a faucet. Avoid close contacts especially the very [place] and the elderly. Cover your mouth when you cough or sneeze. Always remember to wash your hands.  Get Help Right Away If: You develop worsening fever or sinus pain. You develop a severe head ache or visual changes. Your symptoms persist after you have completed your treatment plan.  Make sure you Understand these instructions. Will watch your condition. Will get help  right away if you are not doing well or get worse.   Thank you for choosing an e-visit.  Your e-visit answers were reviewed by a board certified advanced clinical practitioner to complete your personal care plan. Depending upon the condition, your plan could have included both over the counter or prescription medications.  Please review your pharmacy choice. Make sure the pharmacy is open so you can pick up prescription now. If there is a problem, you may contact your provider through MyChart messaging and have the prescription routed to another pharmacy.  Your safety is important to us. If you have drug allergies check your prescription carefully.   For the next 24 hours you can use MyChart to ask questions about today's visit, request a non-urgent call back, or ask for a work or school excuse. You will get an email in the next two days asking about your experience. I hope that your e-visit has been valuable and will speed your recovery.   

## 2022-04-06 NOTE — Progress Notes (Signed)
I have spent 5 minutes in review of e-visit questionnaire, review and updating patient chart, medical decision making and response to patient.   Finneas Mathe Cody Dafne Nield, PA-C    

## 2022-06-24 ENCOUNTER — Encounter: Payer: Self-pay | Admitting: Internal Medicine

## 2022-07-22 ENCOUNTER — Ambulatory Visit: Payer: Managed Care, Other (non HMO) | Admitting: Internal Medicine

## 2022-07-28 ENCOUNTER — Encounter: Payer: Self-pay | Admitting: Internal Medicine

## 2022-07-28 ENCOUNTER — Ambulatory Visit: Payer: Managed Care, Other (non HMO) | Admitting: Internal Medicine

## 2022-07-28 VITALS — BP 118/62 | HR 60 | Temp 98.0°F | Resp 16 | Ht 62.0 in | Wt 125.2 lb

## 2022-07-28 DIAGNOSIS — E785 Hyperlipidemia, unspecified: Secondary | ICD-10-CM | POA: Diagnosis not present

## 2022-07-28 DIAGNOSIS — Z0000 Encounter for general adult medical examination without abnormal findings: Secondary | ICD-10-CM | POA: Diagnosis not present

## 2022-07-28 DIAGNOSIS — B977 Papillomavirus as the cause of diseases classified elsewhere: Secondary | ICD-10-CM | POA: Insufficient documentation

## 2022-07-28 LAB — COMPREHENSIVE METABOLIC PANEL
ALT: 12 U/L (ref 0–35)
AST: 16 U/L (ref 0–37)
Albumin: 4 g/dL (ref 3.5–5.2)
Alkaline Phosphatase: 72 U/L (ref 39–117)
BUN: 17 mg/dL (ref 6–23)
CO2: 29 mEq/L (ref 19–32)
Calcium: 9.4 mg/dL (ref 8.4–10.5)
Chloride: 104 mEq/L (ref 96–112)
Creatinine, Ser: 0.77 mg/dL (ref 0.40–1.20)
GFR: 83.8 mL/min (ref >60.00)
Glucose, Bld: 86 mg/dL (ref 70–99)
Potassium: 4.1 mEq/L (ref 3.5–5.1)
Sodium: 140 mEq/L (ref 135–145)
Total Bilirubin: 0.7 mg/dL (ref 0.2–1.2)
Total Protein: 6.4 g/dL (ref 6.0–8.3)

## 2022-07-28 LAB — CBC WITH DIFFERENTIAL/PLATELET
Basophils Absolute: 0 10*3/uL (ref 0.0–0.1)
Basophils Relative: 0.6 % (ref 0.0–3.0)
Eosinophils Absolute: 0.1 10*3/uL (ref 0.0–0.7)
Eosinophils Relative: 1.8 % (ref 0.0–5.0)
HCT: 39.4 % (ref 36.0–46.0)
Hemoglobin: 13.2 g/dL (ref 12.0–15.0)
Lymphocytes Relative: 34.5 % (ref 12.0–46.0)
Lymphs Abs: 1.4 10*3/uL (ref 0.7–4.0)
MCHC: 33.6 g/dL (ref 30.0–36.0)
MCV: 92.2 fl (ref 78.0–100.0)
Monocytes Absolute: 0.3 10*3/uL (ref 0.1–1.0)
Monocytes Relative: 7.6 % (ref 3.0–12.0)
Neutro Abs: 2.3 10*3/uL (ref 1.4–7.7)
Neutrophils Relative %: 55.5 % (ref 43.0–77.0)
Platelets: 214 10*3/uL (ref 150.0–400.0)
RBC: 4.28 Mil/uL (ref 3.87–5.11)
RDW: 13.2 % (ref 11.5–15.5)
WBC: 4.2 10*3/uL (ref 4.0–10.5)

## 2022-07-28 LAB — LIPID PANEL
Cholesterol: 207 mg/dL — ABNORMAL HIGH (ref 0–200)
HDL: 62.9 mg/dL (ref >39.00)
LDL Cholesterol: 128 mg/dL — ABNORMAL HIGH (ref 0–99)
NonHDL: 143.84
Total CHOL/HDL Ratio: 3
Triglycerides: 77 mg/dL (ref 0.0–149.0)
VLDL: 15.4 mg/dL (ref 0.0–40.0)

## 2022-07-28 LAB — TSH: TSH: 2.55 u[IU]/mL (ref 0.35–5.50)

## 2022-07-28 NOTE — Assessment & Plan Note (Signed)
Here for CPX GERD: no major sxs, on PPIs Dyslipidemia: Diet controlled Paresthesias (periorbital): Saw neurology 03/03/2022, they felt MRI would be low yield, was recommended observations. DJD: Saw rheumatology April 23, conservative treatment recommended RTC 1 year

## 2022-07-28 NOTE — Patient Instructions (Addendum)
Vaccines I recommend:  Covid booster  RSV vaccine Flu shot    GO TO THE LAB : Get the blood work     [place], [place] back for a physical exam in 1 year      "Living will", "[place] of attorney": Advanced care planning  (If you already have a living will or healthcare power of attorney, please bring the copy to be scanned in your chart.)  Advance care planning is a process that supports adults in  understanding and sharing their preferences regarding future medical care.   The patient's preferences are recorded in documents called Advance Directives.    Advanced directives are completed (and can be modified at any time) while the patient is in full mental capacity.   The documentation should be available at all times to the patient, the family and the healthcare providers.  Bring in a copy to be scanned in your chart is an excellent idea and is recommended   This legal documents direct treatment decision making and/or appoint a surrogate to make the decision if the patient is not capable to do so.    Advance directives can be documented in many types of formats,  documents have names such as:  Lliving will  Durable power of attorney for healthcare (healthcare proxy or healthcare power of attorney)  Combined directives  Physician orders for life-sustaining treatment    More information at:  [email or web address]

## 2022-07-28 NOTE — Assessment & Plan Note (Signed)
-   TD 2015 -shingrix x 2 - RSV d/w pt  -COVID VAX booster pro> cons  - flu shot at work   -Female care: per gyn, had a MMG 12-2021 per pt  - DEXAs at gyn, next 09-2022 per pt  -CCS: Colonoscopy 02-2011, cscope 07-01-21, next  per GI. - POA discussed - Labs: CBC CMP FLP TSH

## 2022-07-28 NOTE — Progress Notes (Signed)
Subjective:    Patient ID: Heather Collins, female    DOB: 07-Nov-1961, 61 y.o.   MRN: [id number]  DOS:  07/28/2022 Type of visit - description: CPX  Since the last office visit is doing well. 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 [employer]  Tobacco Use   Smoking status: Former    Packs/day: 0.25    Years: 4.00    Total pack years: 1.00    Types: Cigarettes    Quit date: 11/27/1993    Years since quitting: 28.6    Passive exposure: Never   Smokeless tobacco: Never   Tobacco comments:    quit in the 90s  Vaping Use   Vaping Use: Never used  Substance and Sexual Activity    Alcohol use: No   Drug use: Never   Sexual activity: Not on file  Other Topics Concern   Not on file  Social History Narrative   Son, 1997, going to college to graduate 10-2021   Lives with husband   Social Determinants of Health   Financial Resource Strain: Not on file  Food Insecurity: Not on file  Transportation Needs: Not on file  Physical Activity: Not on file  Stress: Not on file  Social Connections: Not on file  Intimate Partner Violence: Not on file    Current Outpatient Medications  Medication Instructions   b complex vitamins capsule 1 capsule, Oral, Daily   CALCIUM PO Oral   cetirizine (ZYRTEC) 10 mg, Oral, Daily   Glucosamine HCl (GLUCOSAMINE PO) Oral   omega-3 acid ethyl esters (LOVAZA) 1 g capsule TAKE 1 CAPSULE TWICE A DAY   Omeprazole Magnesium (PRILOSEC PO) Oral   sodium fluoride-calcium carbonate (FLORICAL) 8.3-364 MG CAPS capsule Florical 3.75 mg (8.25)-145 mg (364) capsule   TART CHERRY PO Oral   TURMERIC PO 1 tablet, Oral, Daily       Objective:   Physical Exam BP 118/62   Pulse 60   Temp 98 F (36.7 C) (Oral)   Resp 16   Ht '5\' 2"'$  (1.575 m)   Wt 125  lb 4 oz (56.8 kg)   LMP 12/04/2017 (Approximate)   SpO2 97%   BMI 22.91 kg/m  General: Well developed, NAD, BMI noted Neck: No  thyromegaly  HEENT:  Normocephalic . Face symmetric, atraumatic Lungs:  CTA B Normal respiratory effort, no intercostal retractions, no accessory muscle use. Heart: RRR,  no murmur.  Abdomen:  Not distended, soft, non-tender. No rebound or rigidity.   Lower extremities: no pretibial edema bilaterally  Skin: Exposed areas without rash. Not pale. Not jaundice Neurologic:  alert & oriented X3.  Speech normal, gait appropriate for age and unassisted Strength symmetric and appropriate for age.  Psych: Cognition and judgment appear intact.  Cooperative with normal attention span and concentration.  Behavior appropriate. No anxious or depressed appearing.      Assessment     Assessment  GERD Dyslipidemia DJD Menopausal, hysterectomy for abnormal Paps 11-2019, cervix pathology acute and chronic cervicitis RLS Migraine headaches HOH , on Florical to prevent calcium deposits in the middle ear EGD 02-2015 wnl, BX negative  PLAN Here for CPX GERD: no major sxs, on PPIs Dyslipidemia: Diet controlled Paresthesias (periorbital): Saw neurology 03/03/2022, they felt MRI would be low yield, was recommended observations. DJD: Saw rheumatology April 23, conservative treatment recommended RTC 1 year

## 2022-07-29 ENCOUNTER — Encounter: Payer: Self-pay | Admitting: Internal Medicine

## 2022-08-06 ENCOUNTER — Ambulatory Visit
Admission: RE | Admit: 2022-08-06 | Discharge: 2022-08-06 | Disposition: A | Discharge status: Home / Self Care | Payer: Managed Care, Other (non HMO) | Source: Ambulatory Visit | Attending: Urgent Care | Admitting: Urgent Care

## 2022-08-06 ENCOUNTER — Ambulatory Visit (INDEPENDENT_AMBULATORY_CARE_PROVIDER_SITE_OTHER): Payer: Managed Care, Other (non HMO)

## 2022-08-06 VITALS — BP 104/64 | HR 70 | Temp 98.1°F | Resp 16

## 2022-08-06 DIAGNOSIS — S20212A Contusion of left front wall of thorax, initial encounter: Secondary | ICD-10-CM

## 2022-08-06 DIAGNOSIS — R0781 Pleurodynia: Secondary | ICD-10-CM | POA: Diagnosis not present

## 2022-08-06 MED ORDER — NAPROXEN 500 MG PO TABS: 500.0000 mg | ORAL_TABLET | Freq: Two times a day (BID) | ORAL | 0 refills | Status: DC

## 2022-08-06 NOTE — ED Provider Notes (Signed)
[facility]  Note:  This document was prepared using Systems analyst and may include unintentional dictation errors.  MRN: [id number] DOB: 1961-04-11  Subjective:   Heather Collins is a 61 y.o. female presenting for 1 day history of acute onset persistent left lower rib pain.  Patient was trying to climb over her dryer to reach something.  Unfortunately she lost her balance and ended up hitting the corner with her left lower ribs.  She has since had persistent pain.  No difficulty with her breathing from the injury.  However, she wants to make sure there is no rib fracture.  No bruising, swelling.  No current facility-administered medications for this encounter.  Current Outpatient Medications:    b complex vitamins capsule, Take 1 capsule by mouth daily., Disp: , Rfl:    CALCIUM PO, Take by mouth., Disp: , Rfl:    cetirizine (ZYRTEC) 10 MG tablet, Take 10 mg by mouth daily., Disp: , Rfl:    Glucosamine HCl (GLUCOSAMINE PO), Take by mouth., Disp: , Rfl:    omega-3 acid ethyl esters (LOVAZA) 1 g capsule, TAKE 1 CAPSULE TWICE A DAY, Disp: 180 capsule, Rfl: 1   Omeprazole Magnesium (PRILOSEC PO), Take by mouth., Disp: , Rfl:    sodium fluoride-calcium carbonate (FLORICAL) 8.3-364 MG CAPS capsule, Florical 3.75 mg (8.25)-145 mg (364) capsule, Disp: , Rfl:    TART CHERRY PO, Take by mouth., Disp: , Rfl:    TURMERIC PO, Take 1 tablet by mouth daily. 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 Cresenciano Lick, with a 4.0 mm titanium Quentin Cornwall prosthesis with a large well and narrow shaft over a vein graft   STAPEDECTOMY Right 2008   UPPER GASTROINTESTINAL ENDOSCOPY      Family History  Problem Relation Age of Onset   Heart disease Mother 62       at age 61   Arthritis Mother    Lung cancer Father    Diabetes Sister    Healthy Brother    Healthy Brother    Coronary artery disease Paternal Uncle    Healthy Son    Colon cancer Neg Hx    Breast cancer Neg Hx    Colon polyps Neg Hx    Esophageal cancer Neg Hx    Stomach cancer Neg Hx    Rectal cancer Neg Hx     Social History   Tobacco Use   Smoking status: Former    Packs/day: 0.25    Years: 4.00    Total pack years: 1.00  Types: Cigarettes    Quit date: 11/27/1993    Years since quitting: 28.7    Passive exposure: Never   Smokeless tobacco: Never   Tobacco comments:    quit in the 90s  Vaping Use   Vaping Use: Never used  Substance Use Topics   Alcohol use: No   Drug use: Never    ROS   Objective:   Vitals: BP 104/64 (BP Location: Right Arm)   Pulse 70   Temp 98.1 F (36.7 C) (Oral)   Resp 16   LMP 12/04/2017 (Approximate)   SpO2 99%   Physical Exam Constitutional:      General: She is not in acute distress.    Appearance: Normal appearance. She is well-developed. She is not ill-appearing, toxic-appearing or diaphoretic.  HENT:     Head: Normocephalic and atraumatic.     Nose: Nose normal.     Mouth/Throat:     Mouth: Mucous membranes are moist.  Eyes:     General: No scleral  icterus.       Right eye: No discharge.        Left eye: No discharge.     Extraocular Movements: Extraocular movements intact.  Cardiovascular:     Rate and Rhythm: Normal rate and regular rhythm.     Heart sounds: Normal heart sounds. No murmur heard.    No friction rub. No gallop.  Pulmonary:     Effort: Pulmonary effort is normal. No respiratory distress.     Breath sounds: No stridor. No wheezing, rhonchi or rales.  Chest:     Chest wall: Tenderness present.    Skin:    General: Skin is warm and dry.  Neurological:     General: No focal deficit present.     Mental Status: She is alert and oriented to person, place, and time.  Psychiatric:        Mood and Affect: Mood normal.        Behavior: Behavior normal.     DG Ribs Unilateral W/Chest Left  Result Date: 08/06/2022 CLINICAL DATA:  Left rib pain after injury. EXAM: LEFT RIBS AND CHEST - 3+ VIEW COMPARISON:  None Available. FINDINGS: Frontal chest radiograph demonstrates normal heart size and mediastinal contours. There is no evidence of pulmonary edema, consolidation, pneumothorax or pleural fluid. On 1 of the left rib films, there is potential subtle lucency overlying the lateral aspect of the left seventh rib. IMPRESSION: Possible nondisplaced fracture of the lateral aspect of the left seventh rib. No associated pneumothorax or pleural fluid. No acute findings in the chest. Electronically Signed   By: Aletta Edouard M.D.   On: 08/06/2022 14:15     Assessment and Plan :   PDMP not reviewed this encounter.  1. Rib pain on left side

## 2022-08-06 NOTE — ED Triage Notes (Signed)
Pt states that she hit her lt lower anterior ribs.

## 2022-09-13 ENCOUNTER — Other Ambulatory Visit: Payer: Self-pay | Admitting: Physician Assistant

## 2022-09-13 NOTE — Telephone Encounter (Signed)
Next Visit: 02/24/2023  Last Visit: 02/23/2022  Last Fill: 10/05/2021  Dx: Sueanne Margarita osteoarthritis of both hands   Current Dose per office note on 02/23/2022: not discussed  Okay to refill Lovaza?

## 2023-01-20 ENCOUNTER — Ambulatory Visit (INDEPENDENT_AMBULATORY_CARE_PROVIDER_SITE_OTHER): Payer: Managed Care, Other (non HMO)

## 2023-01-20 ENCOUNTER — Ambulatory Visit: Payer: Managed Care, Other (non HMO) | Admitting: Podiatry

## 2023-01-20 DIAGNOSIS — M7661 Achilles tendinitis, right leg: Secondary | ICD-10-CM | POA: Diagnosis not present

## 2023-01-20 DIAGNOSIS — R52 Pain, unspecified: Secondary | ICD-10-CM

## 2023-01-20 DIAGNOSIS — M722 Plantar fascial fibromatosis: Secondary | ICD-10-CM

## 2023-01-20 DIAGNOSIS — M7662 Achilles tendinitis, left leg: Secondary | ICD-10-CM

## 2023-01-20 MED ORDER — MELOXICAM 7.5 MG PO TABS: 7.5000 mg | ORAL_TABLET | Freq: Every day | ORAL | 0 refills | Status: DC | PRN

## 2023-01-20 NOTE — Progress Notes (Signed)
Subjective:   Patient ID: Heather Collins, female   DOB: 62 y.o.   MRN: [id number]   HPI Chief Complaint  Patient presents with   Foot Pain    PATIENT FEELS SHE HAS PF, WALKS CONSTANTLY WALKING AT [place], PAIN LOCATED NEAR THE ACHILLES AND ARCH OF BILATERAL FEET   62 year old female presents the office today with above concerns.  She states that she has pain mostly in the afternoon.  The mornings.  She does stretch in the morning as well.  She does not recall any recent injury.  She has no numbness or tingling or any radiating pain.  This has been stretching no other significant treatment.  No other concerns.   Review of Systems  All other systems reviewed and are negative.   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 Cresenciano Lick, with a 4.0 mm titanium Quentin Cornwall prosthesis with a large well and narrow shaft over a vein graft   STAPEDECTOMY Right 2008   UPPER GASTROINTESTINAL ENDOSCOPY       Current Outpatient Medications:    b complex vitamins capsule, Take 1 capsule by mouth daily., Disp: , Rfl:    CALCIUM PO, Take by mouth., Disp: , Rfl:    cetirizine (ZYRTEC) 10 MG tablet, Take 10 mg  by mouth daily., Disp: , Rfl:    Glucosamine HCl (GLUCOSAMINE PO), Take by mouth., Disp: , Rfl:    meloxicam (MOBIC) 7.5 MG tablet, Take 1 tablet (7.5 mg total) by mouth daily as needed for pain., Disp: 30 tablet, Rfl: 0   naproxen (NAPROSYN) 500 MG tablet, Take 1 tablet (500 mg total) by mouth 2 (two) times daily with a meal., Disp: 30 tablet, Rfl: 0   omega-3 acid ethyl esters (LOVAZA) 1 g capsule, TAKE 1 CAPSULE TWICE A DAY, Disp: 180 capsule, Rfl: 3   Omeprazole Magnesium (PRILOSEC PO), Take by mouth., Disp: , Rfl:    sodium fluoride-calcium carbonate (FLORICAL) 8.3-364 MG CAPS capsule, Florical 3.75 mg (8.25)-145 mg (364) capsule, Disp: , Rfl:    TART CHERRY PO, Take by mouth., Disp: , Rfl:    TURMERIC PO, Take 1 tablet by mouth daily. , Disp: , Rfl:   Allergies  Allergen Reactions   Sulfa Antibiotics           Objective:  Physical Exam  General: AAO x3, NAD  Dermatological: Skin is warm, dry and supple bilateral. There are no open sores, no preulcerative lesions, no rash or signs of infection present.  Vascular: Dorsalis Pedis artery and Posterior Tibial artery pedal pulses are 2/4 bilateral with immedate capillary fill time. There is no pain with calf compression, swelling, warmth, erythema.   Neruologic: Grossly intact via  light touch bilateral.  Negative Tinel sign.  Musculoskeletal: No joint tenderness is localized on the distal portion of the Achilles tendon.  Clinically the Achilles tendon appears to be intact and Thompson test is negative.  There is no edema, erythema.  Mild discomfort the arch upon the medial band of plantar fascia.  No area pinpoint tenderness.  MMT 5/5.  Gait: Unassisted, Nonantalgic.       Assessment:   63 year old female with plantar fasciitis, Achilles tendinitis     Plan:  -Treatment options discussed including all alternatives, risks, and complications -Etiology of symptoms were discussed -X-rays were obtained and reviewed with the  patient.  3 views bilateral feet and 2 views bilateral ankles were obtained.  No evidence of acute fracture noted.  Minimal posterior calcaneal spurring is starting. -Prescribed mobic. Discussed side effects of the medication and directed to stop if any are to occur and call the office.  -We discussed stretching, icing on a regular basis.  We discussed using good arch supports.  Will check orthotic benefit coverage for her as well we discussed over-the-counter inserts for now.  Trula Slade DPM

## 2023-01-20 NOTE — Patient Instructions (Signed)
For inserts I like Powersteps, Superfeet, Aetrex   For inserts like Vionic  Achilles Tendinitis  with Rehab Achilles tendinitis is a disorder of the Achilles tendon. The Achilles tendon connects the large calf muscles (Gastrocnemius and Soleus) to the heel bone (calcaneus). This tendon is sometimes called the heel cord. It is important for pushing-off and standing on your toes and is important for walking, running, or jumping. Tendinitis is often caused by overuse and repetitive microtrauma. SYMPTOMS Pain, tenderness, swelling, warmth, and redness may occur over the Achilles tendon even at rest. Pain with pushing off, or flexing or extending the ankle. Pain that is worsened after or during activity. CAUSES  Overuse sometimes seen with rapid increase in exercise programs or in sports requiring running and jumping. Poor physical conditioning (strength and flexibility or endurance). Running sports, especially training running down hills. Inadequate warm-up before practice or play or failure to stretch before participation. Injury to the tendon. PREVENTION  Warm up and stretch before practice or competition. Allow time for adequate rest and recovery between practices and competition. Keep up conditioning. Keep up ankle and leg flexibility. Improve or keep muscle strength and endurance. Improve cardiovascular fitness. Use proper technique. Use proper equipment (shoes, skates). To help prevent recurrence, taping, protective strapping, or an adhesive bandage may be recommended for several weeks after healing is complete. PROGNOSIS  Recovery may take weeks to several months to heal. Longer recovery is expected if symptoms have been prolonged. Recovery is usually quicker if the inflammation is due to a direct blow as compared with overuse or sudden strain. RELATED COMPLICATIONS  Healing time will be prolonged if the condition is not correctly treated. The injury must be given plenty of time to  heal. Symptoms can reoccur if activity is resumed too soon. Untreated, tendinitis may increase the risk of tendon rupture requiring additional time for recovery and possibly surgery. TREATMENT  The first treatment consists of rest anti-inflammatory medication, and ice to relieve the pain. Stretching and strengthening exercises after resolution of pain will likely help reduce the risk of recurrence. Referral to a physical therapist or athletic trainer for further evaluation and treatment may be helpful. A walking boot or cast may be recommended to rest the Achilles tendon. This can help break the cycle of inflammation and microtrauma. Arch supports (orthotics) may be prescribed or recommended by your caregiver as an adjunct to therapy and rest. Surgery to remove the inflamed tendon lining or degenerated tendon tissue is rarely necessary and has shown less than predictable results. MEDICATION  Nonsteroidal anti-inflammatory medications, such as aspirin and ibuprofen, may be used for pain and inflammation relief. Do not take within 7 days before surgery. Take these as directed by your caregiver. Contact your caregiver immediately if any bleeding, stomach upset, or signs of allergic reaction occur. Other minor pain relievers, such as acetaminophen, may also be used. Pain relievers may be prescribed as necessary by your caregiver. Do not take prescription pain medication for longer than 4 to 7 days. Use only as directed and only as much as you need. Cortisone injections are rarely indicated. Cortisone injections may weaken tendons and predispose to rupture. It is better to give the condition more time to heal than to use them. HEAT AND COLD Cold is used to relieve pain and reduce inflammation for acute and chronic Achilles tendinitis. Cold should be applied for 10 to 15 minutes every 2 to 3 hours for inflammation and pain and immediately after any activity that aggravates your symptoms.  Use ice packs or an  ice massage. Heat may be used before performing stretching and strengthening activities prescribed by your caregiver. Use a heat pack or a warm soak. SEEK MEDICAL CARE IF: Symptoms get worse or do not improve in 2 weeks despite treatment. New, unexplained symptoms develop. Drugs used in treatment may produce side effects.  EXERCISES:  RANGE OF MOTION (ROM) AND STRETCHING EXERCISES - Achilles Tendinitis  These exercises may help you when beginning to rehabilitate your injury. Your symptoms may resolve with or without further involvement from your physician, physical therapist or athletic trainer. While completing these exercises, remember:  Restoring tissue flexibility helps normal motion to return to the joints. This allows healthier, less painful movement and activity. An effective stretch should be held for at least 30 seconds. A stretch should never be painful. You should only feel a gentle lengthening or release in the stretched tissue.  STRETCH  Gastroc, Standing  Place hands on wall. Extend right / left leg, keeping the front knee somewhat bent. Slightly point your toes inward on your back foot. Keeping your right / left heel on the floor and your knee straight, shift your weight toward the wall, not allowing your back to arch. You should feel a gentle stretch in the right / left calf. Hold this position for 10 seconds. Repeat 3 times. Complete this stretch 2 times per day.  STRETCH  Soleus, Standing  Place hands on wall. Extend right / left leg, keeping the other knee somewhat bent. Slightly point your toes inward on your back foot. Keep your right / left heel on the floor, bend your back knee, and slightly shift your weight over the back leg so that you feel a gentle stretch deep in your back calf. Hold this position for 10 seconds. Repeat 3 times. Complete this stretch 2 times per day.  STRETCH  Gastrocsoleus, Standing  Note: This exercise can place a lot of stress on your  foot and ankle. Please complete this exercise only if specifically instructed by your caregiver.  Place the ball of your right / left foot on a step, keeping your other foot firmly on the same step. Hold on to the wall or a rail for balance. Slowly lift your other foot, allowing your body weight to press your heel down over the edge of the step. You should feel a stretch in your right / left calf. Hold this position for 10 seconds. Repeat this exercise with a slight bend in your knee. Repeat 3 times. Complete this stretch 2 times per day.   STRENGTHENING EXERCISES - Achilles Tendinitis These exercises may help you when beginning to rehabilitate your injury. They may resolve your symptoms with or without further involvement from your physician, physical therapist or athletic trainer. While completing these exercises, remember:  Muscles can gain both the endurance and the strength needed for everyday activities through controlled exercises. Complete these exercises as instructed by your physician, physical therapist or athletic trainer. Progress the resistance and repetitions only as guided. You may experience muscle soreness or fatigue, but the pain or discomfort you are trying to eliminate should never worsen during these exercises. If this pain does worsen, stop and make certain you are following the directions exactly. If the pain is still present after adjustments, discontinue the exercise until you can discuss the trouble with your clinician.  STRENGTH - Plantar-flexors  Sit with your right / left leg extended. Holding onto both ends of a rubber exercise band/tubing, loop it  around the ball of your foot. Keep a slight tension in the band. Slowly push your toes away from you, pointing them downward. Hold this position for 10 seconds. Return slowly, controlling the tension in the band/tubing. Repeat 3 times. Complete this exercise 2 times per day.   STRENGTH - Plantar-flexors  Stand with your  feet shoulder width apart. Steady yourself with a wall or table using as little support as needed. Keeping your weight evenly spread over the width of your feet, rise up on your toes.* Hold this position for 10 seconds. Repeat 3 times. Complete this exercise 2 times per day.  *If this is too easy, shift your weight toward your right / left leg until you feel challenged. Ultimately, you may be asked to do this exercise with your right / left foot only.  STRENGTH  Plantar-flexors, Eccentric  Note: This exercise can place a lot of stress on your foot and ankle. Please complete this exercise only if specifically instructed by your caregiver.  Place the balls of your feet on a step. With your hands, use only enough support from a wall or rail to keep your balance. Keep your knees straight and rise up on your toes. Slowly shift your weight entirely to your right / left toes and pick up your opposite foot. Gently and with controlled movement, lower your weight through your right / left foot so that your heel drops below the level of the step. You will feel a slight stretch in the back of your calf at the end position. Use the healthy leg to help rise up onto the balls of both feet, then lower weight only on the right / left leg again. Build up to 15 repetitions. Then progress to 3 consecutive sets of 15 repetitions.* After completing the above exercise, complete the same exercise with a slight knee bend (about 30 degrees). Again, build up to 15 repetitions. Then progress to 3 consecutive sets of 15 repetitions.* Perform this exercise 2 times per day.  *When you easily complete 3 sets of 15, your physician, physical therapist or athletic trainer may advise you to add resistance by wearing a backpack filled with additional weight.  STRENGTH - Plantar Flexors, Seated  Sit on a chair that allows your feet to rest flat on the ground. If necessary, sit at the edge of the chair. Keeping your toes firmly on the  ground, lift your right / left heel as far as you can without increasing any discomfort in your ankle. Repeat 3 times. Complete this exercise 2 times a day.

## 2023-02-07 ENCOUNTER — Other Ambulatory Visit: Payer: Self-pay | Admitting: Obstetrics and Gynecology

## 2023-02-07 DIAGNOSIS — Z1231 Encounter for screening mammogram for malignant neoplasm of breast: Secondary | ICD-10-CM

## 2023-02-10 NOTE — Progress Notes (Signed)
Office Visit Note  Patient: Heather Collins             Date of Birth: [birth year]           MRN: [id number]             PCP: Wanda Plump, MD Referring: Wanda Plump, MD Visit Date: 02/24/2023 Occupation: @GUAROCC @  Subjective:  Intermittent joint pain  History of Present Illness: Heather Collins is a 62 y.o. female with history of inflammatory osteoarthritis.  She states that overall she is doing good but with certain activities she starts having discomfort in her hands.  She still notices some swelling in her hands.  She has noticed more prominence in her bilateral great toe.  She has modified shoes which has been helpful.  None of the other joints are painful.  She has been taking some natural supplements and doing exercises on a regular basis.    Activities of Daily Living:  Patient reports morning stiffness for 0 minutes.   Patient Denies nocturnal pain.  Difficulty dressing/grooming: Denies Difficulty climbing stairs: Denies Difficulty getting out of chair: Denies Difficulty using hands for taps, buttons, cutlery, and/or writing: Denies  Review of Systems  Constitutional:  Negative for fatigue.  HENT:  Negative for mouth sores and mouth dryness.   Eyes:  Negative for dryness.  Respiratory:  Negative for shortness of breath.   Cardiovascular:  Negative for chest pain and palpitations.  Gastrointestinal:  Negative for blood in stool, constipation and diarrhea.  Endocrine: Negative for increased urination.  Genitourinary:  Negative for involuntary urination.  Musculoskeletal:  Negative for joint pain, gait problem, joint pain, joint swelling, myalgias, muscle weakness, morning stiffness, muscle tenderness and myalgias.  Skin:  Negative for color change, rash, hair loss and sensitivity to sunlight.  Allergic/Immunologic: Negative for susceptible to infections.  Neurological:  Negative for dizziness and headaches.  Hematological:  Negative for swollen glands.   Psychiatric/Behavioral:  Negative for depressed mood and sleep disturbance. The patient is not nervous/anxious.     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   Abnormal Pap smear of cervix 11/29/2019   Status post total hysterectomy 11/29/2019   Annual physical exam 05/18/2017   PCP NOTES >>>>>>>>>>>>>> 05/18/2017   Contact dermatitis due to poison ivy 07/07/2013   Dizziness and giddiness 05/25/2013   Dyslipidemia 04/11/2012   Left upper quadrant pain 04/11/2012   CARPAL TUNNEL SYNDROME 01/01/2011   RESTLESS LEG SYNDROME 11/25/2007   MIGRAINE HEADACHE 11/25/2007   G E R D 05/31/2007    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    Otosclerosis involving oval window, nonobliterative    Bilateral   Restless legs syndrome (RLS)     Family History  Problem Relation Age of Onset   Heart disease Mother 1       at age 39   Arthritis Mother    Lung cancer Father    Diabetes Sister    Healthy Brother    Healthy Brother    Coronary artery disease Paternal Uncle    Healthy Son    Colon cancer Neg Hx  Breast cancer Neg Hx    Colon polyps Neg Hx    Esophageal cancer Neg Hx    Stomach cancer Neg Hx    Rectal cancer Neg Hx    Past Surgical History:  Procedure Laterality Date   COLONOSCOPY     LIPOMA EXCISION  12/05/2020   x2   ROBOTIC ASSISTED LAPAROSCOPIC HYSTERECTOMY AND SALPINGECTOMY Bilateral 11/29/2019   Procedure: XI ROBOTIC ASSISTED LAPAROSCOPIC HYSTERECTOMY AND SALPINGECTOMY;  Surgeon: Maxie Better, MD;  Location: [facility] [place];  Service: Gynecology;  Laterality: Bilateral;   STAPEDECTOMY Left 2016   Dr. Jac Canavan,  with a 4.0 mm titanium Roxan Hockey prosthesis with a large well and narrow shaft over a vein graft   STAPEDECTOMY Right 2008   UPPER GASTROINTESTINAL ENDOSCOPY     Social History   Social History Narrative   Son, 1997, going to college to graduate 10-2021   Lives with husband   Immunization History  Administered Date(s) Administered   DTaP 04/04/2013   Influenza Whole 09/11/2012   Influenza-Unspecified 08/08/2016, 08/03/2017, 08/07/2019, 08/18/2021   PFIZER(Purple Top)SARS-COV-2 Vaccination 12/26/2019, 01/18/2020, 09/05/2020   Td 11/08/2013   Zoster Recombinat (Shingrix) 07/20/2021, 09/22/2021     Objective: Vital Signs: BP 97/66 (BP Location: Left Arm, Patient Position: Sitting, Cuff Size: Normal)   Pulse (!) 58   Resp 14   Ht  (1.575 m)   Wt 126 lb 6.4 oz (57.3 kg)   LMP 12/04/2017 (Approximate)   BMI 23.12 kg/m    Physical Exam Vitals and nursing note reviewed.  Constitutional:      Appearance: She is well-developed.  HENT:     Head: Normocephalic and atraumatic.  Eyes:     Conjunctiva/sclera: Conjunctivae normal.  Cardiovascular:     Rate and Rhythm: Normal rate and regular rhythm.     Heart sounds: Normal heart sounds.  Pulmonary:     Effort: Pulmonary effort is normal.     Breath sounds: Normal breath sounds.  Abdominal:     General: Bowel sounds are normal.     Palpations: Abdomen is soft.  Musculoskeletal:     Cervical back: Normal range of motion.  Lymphadenopathy:     Cervical: No cervical adenopathy.  Skin:    General: Skin is warm and dry.     Capillary Refill: Capillary refill takes less than 2 seconds.  Neurological:     Mental Status: She is alert and oriented to person, place, and time.  Psychiatric:        Behavior: Behavior normal.      Musculoskeletal Exam: Cervical, thoracic and lumbar spine were in good range of motion.  Shoulder joints, elbow joints, wrist joints, MCPs PIPs and DIPs were in good range of motion.  She had  inflammation in her left second and fourth PIP joints and right third PIP joint.  Mucinous cyst was noted on the right first PIP joint.  CMC thickening was noted.  Hip joints and knee joints were in good range of motion without any warmth swelling or effusion.  She had bilateral hallux rigidus and PIP and DIP thickening with no synovitis.  Dorsal spurring was noted.  CDAI Exam: CDAI Score: -- Patient Global: --; Provider Global: -- Swollen: --; Tender: -- Joint Exam 02/24/2023   No joint exam has been documented for this visit   There is currently no information documented on the homunculus. Go to the Rheumatology activity and complete the homunculus joint exam.  Investigation: No additional findings.  Imaging: No results found.  Recent Labs: Lab Results  Component Value Date   WBC 4.2 07/28/2022   HGB 13.2 07/28/2022   PLT 214.0 07/28/2022   NA 140 07/28/2022   K 4.1 07/28/2022   CL 104 07/28/2022   CO2 29 07/28/2022   GLUCOSE 86 07/28/2022   BUN 17 07/28/2022   CREATININE 0.77 07/28/2022   BILITOT 0.7 07/28/2022   ALKPHOS 72 07/28/2022   AST 16 07/28/2022   ALT 12 07/28/2022   PROT 6.4 07/28/2022   ALBUMIN 4.0 07/28/2022   CALCIUM 9.4 07/28/2022   GFRAA >60 11/30/2019    Speciality Comments: No specialty comments available.  Procedures:  No procedures performed Allergies: Sulfa antibiotics   Assessment / Plan:     Visit Diagnoses: Primary osteoarthritis of both hands-she notices discomfort in her hands after doing certain activities.  She has inflammatory arthritis.  Inflammation was noted in her right third and left second and fourth PIP joints.  Mucinous cyst was noted over the left first PIP joint.  Use of topical diclofenac gel was discussed.  Arthritis labs were discussed.  She has been doing exercises which were encouraged.  She has noticed improvement in the mobility of her hands.  She has been using ring splints.  She has not been taking any NSAIDs.  She has  been using natural anti-inflammatories.  Primary osteoarthritis of both feet - Bilateral first MTP thickening and dorsal spurring.  Proper fitting shoes with arch support were discussed.  Exercises of feet were also discussed.  Other medical problems are listed as follows:  Dyslipidemia  History of gastroesophageal reflux (GERD)  RLS (restless legs syndrome)  Hx of migraines  Orders: No orders of the defined types were placed in this encounter.  No orders of the defined types were placed in this encounter.    Follow-Up Instructions: Return in about 1 year (around 02/24/2024) for Osteoarthritis.   Pollyann Savoy, MD  Note - This record has been created using Animal nutritionist.  Chart creation errors have been sought, but may not always  have been located. Such creation errors do not reflect on  the standard of medical care.

## 2023-02-16 ENCOUNTER — Other Ambulatory Visit (HOSPITAL_COMMUNITY): Payer: Self-pay

## 2023-02-16 MED ORDER — OMRON 3 SERIES BP MONITOR DEVI
0 refills | Status: DC
  Filled 2023-02-16: qty 1, 30d supply, fill #0

## 2023-02-24 ENCOUNTER — Encounter: Payer: Self-pay | Admitting: Rheumatology

## 2023-02-24 ENCOUNTER — Ambulatory Visit: Payer: Managed Care, Other (non HMO) | Attending: Rheumatology | Admitting: Rheumatology

## 2023-02-24 ENCOUNTER — Other Ambulatory Visit (HOSPITAL_COMMUNITY): Payer: Self-pay

## 2023-02-24 VITALS — BP 97/66 | HR 58 | Resp 14 | Ht 62.0 in | Wt 126.4 lb

## 2023-02-24 DIAGNOSIS — G2581 Restless legs syndrome: Secondary | ICD-10-CM

## 2023-02-24 DIAGNOSIS — Z8719 Personal history of other diseases of the digestive system: Secondary | ICD-10-CM

## 2023-02-24 DIAGNOSIS — M19072 Primary osteoarthritis, left ankle and foot: Secondary | ICD-10-CM

## 2023-02-24 DIAGNOSIS — M19042 Primary osteoarthritis, left hand: Secondary | ICD-10-CM

## 2023-02-24 DIAGNOSIS — Z8669 Personal history of other diseases of the nervous system and sense organs: Secondary | ICD-10-CM

## 2023-02-24 DIAGNOSIS — M19071 Primary osteoarthritis, right ankle and foot: Secondary | ICD-10-CM

## 2023-02-24 DIAGNOSIS — E785 Hyperlipidemia, unspecified: Secondary | ICD-10-CM | POA: Diagnosis not present

## 2023-02-24 DIAGNOSIS — M19041 Primary osteoarthritis, right hand: Secondary | ICD-10-CM

## 2023-02-25 ENCOUNTER — Other Ambulatory Visit: Payer: Self-pay

## 2023-03-09 ENCOUNTER — Ambulatory Visit: Payer: Managed Care, Other (non HMO) | Admitting: Internal Medicine

## 2023-03-09 VITALS — BP 100/66 | HR 64 | Temp 97.9°F | Resp 16 | Ht 62.0 in | Wt 125.0 lb

## 2023-03-09 DIAGNOSIS — R809 Proteinuria, unspecified: Secondary | ICD-10-CM | POA: Diagnosis not present

## 2023-03-09 DIAGNOSIS — Z005 Encounter for examination of potential donor of organ and tissue: Secondary | ICD-10-CM

## 2023-03-09 LAB — MICROALBUMIN / CREATININE URINE RATIO
Creatinine,U: 27.7 mg/dL
Microalb Creat Ratio: 2.5 mg/g (ref 0.0–30.0)
Microalb, Ur: <0.7 mg/dL (ref 0.0–1.9)

## 2023-03-09 NOTE — Patient Instructions (Addendum)
Provide a urine sample. I will keep you posted with the results

## 2023-03-09 NOTE — Progress Notes (Unsigned)
Subjective:    Patient ID: Heather Collins, female    DOB: February 08, 1961, 62 y.o.   MRN: [id number]  DOS:  03/09/2023 Type of visit - description: acute  The patient had  blood work elsewhere, she has been evaluated to be a kidney donor. Labs were essentially normal except for slightly decreased WBCs. Also a random urine sample shows some protein/creatinine ratio abnormality. She feels well, exercises every day, weight is stable, ambulatory BPs in the low side but denies any dizziness or weakness. No lower extremity edema.   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 Jac Canavan, with a 4.0 mm titanium Roxan Hockey prosthesis with a large well and narrow shaft over a vein graft   STAPEDECTOMY Right 2008   UPPER GASTROINTESTINAL ENDOSCOPY      Current Outpatient Medications  Medication Instructions   b complex vitamins capsule 1 capsule, Oral, Daily   cetirizine (ZYRTEC) 10 mg, Oral, Daily   Glucosamine HCl (GLUCOSAMINE PO) Oral   meloxicam (MOBIC) 7.5 mg, Oral, Daily PRN   naproxen (NAPROSYN) 500 mg, Oral, 2 times daily with meals   omega-3 acid ethyl esters  (LOVAZA) 1 g capsule TAKE 1 CAPSULE TWICE A DAY   Omeprazole Magnesium (PRILOSEC PO) Oral   sodium fluoride-calcium carbonate (FLORICAL) 8.3-364 MG CAPS capsule Florical 3.75 mg (8.25)-145 mg (364) capsule   TART CHERRY PO Oral   TURMERIC PO 1 tablet, Oral, Daily       Objective:   Physical Exam BP 100/66   Pulse 64   Temp 97.9 F (36.6 C) (Oral)   Resp 16   Ht 5\' 2"  (1.575 m)   Wt 125 lb (56.7 kg)   LMP 12/04/2017 (Approximate)   SpO2 99%   BMI 22.86 kg/m  General:   Well developed, NAD, BMI noted. HEENT:  Normocephalic . Face symmetric, atraumatic Skin: Not pale. Not jaundice Neurologic:  alert & oriented X3.  Speech normal, gait appropriate for age and unassisted Psych--  Cognition and judgment appear intact.  Cooperative with normal attention span and concentration.  Behavior appropriate. No anxious or depressed appearing.      Assessment    Assessment  GERD Dyslipidemia DJD Menopausal, hysterectomy for abnormal Paps 11-2019, cervix pathology acute and chronic cervicitis RLS Migraine headaches HOH , on Florical to prevent calcium deposits in the middle ear EGD 02-2015 wnl, BX negative  PLAN Labs done at [facility] February 23, 2023 for evaluation of potential kidney donor. Hepatitis serology, HIV, RPR: All negative. CMP and CBC normal except for a white count of 3.2.,  LDL was 118.  Urine protein/creatinine ratio 266, slightly elevated Proteinuria? Labs reviewed, all okay,  patient is concerned about possible proteinuria.  Her serum albumin & protein are normal.  Creatinine normal.  That is reassuring.  Plan: Recheck a UA, micro.  Further advised with results.

## 2023-03-10 LAB — URINALYSIS, ROUTINE W REFLEX MICROSCOPIC
Bilirubin Urine: NEGATIVE
Hgb urine dipstick: NEGATIVE
Ketones, ur: NEGATIVE
Leukocytes,Ua: NEGATIVE
Nitrite: NEGATIVE
RBC / HPF: NONE SEEN (ref 0–?)
Specific Gravity, Urine: 1.01 (ref 1.000–1.030)
Total Protein, Urine: NEGATIVE
Urine Glucose: NEGATIVE
Urobilinogen, UA: 0.2 (ref 0.0–1.0)
WBC, UA: NONE SEEN (ref 0–?)
pH: 6.5 (ref 5.0–8.0)

## 2023-03-10 NOTE — Assessment & Plan Note (Signed)
Labs done at [facility] February 23, 2023 for evaluation of potential kidney donor. Hepatitis serology, HIV, RPR: All negative. CMP and CBC normal except for a white count of 3.2.,  LDL was 118.  Urine protein/creatinine ratio 266, slightly elevated Proteinuria? Labs reviewed, all okay, patient is concerned about possible proteinuria.  Her serum albumin & protein are normal.  Creatinine normal.  That is reassuring.  Plan: Recheck a UA, micro.  Further advised with results.

## 2023-03-22 ENCOUNTER — Ambulatory Visit: Payer: Managed Care, Other (non HMO)

## 2023-05-11 LAB — HM DEXA SCAN

## 2023-05-11 LAB — HM MAMMOGRAPHY

## 2023-06-08 ENCOUNTER — Telehealth: Payer: Self-pay | Admitting: Internal Medicine

## 2023-06-08 DIAGNOSIS — E785 Hyperlipidemia, unspecified: Secondary | ICD-10-CM

## 2023-06-08 DIAGNOSIS — Z0000 Encounter for general adult medical examination without abnormal findings: Secondary | ICD-10-CM

## 2023-06-08 DIAGNOSIS — R739 Hyperglycemia, unspecified: Secondary | ICD-10-CM

## 2023-06-08 NOTE — Addendum Note (Signed)
Addended byConrad Kingston D on: 06/08/2023 01:49 PM   Modules accepted: Orders

## 2023-06-08 NOTE — Telephone Encounter (Signed)
BMP FLP CBC TSH

## 2023-06-08 NOTE — Telephone Encounter (Signed)
Patient rescheduled her physical but it's at 3pm in the afternoon. Pt  would like to have the lab orders placed so she can come in prior to to get labs done. Please advise when ready to schedule.

## 2023-06-08 NOTE — Telephone Encounter (Signed)
Orders placed. Please schedule several days prior to CPX appt. Thank you,

## 2023-06-08 NOTE — Telephone Encounter (Signed)
Please advise 

## 2023-07-29 ENCOUNTER — Encounter: Payer: Managed Care, Other (non HMO) | Admitting: Internal Medicine

## 2023-08-08 ENCOUNTER — Other Ambulatory Visit: Payer: Managed Care, Other (non HMO)

## 2023-08-19 ENCOUNTER — Encounter: Payer: Managed Care, Other (non HMO) | Admitting: Internal Medicine

## 2023-08-22 ENCOUNTER — Other Ambulatory Visit (INDEPENDENT_AMBULATORY_CARE_PROVIDER_SITE_OTHER): Payer: Managed Care, Other (non HMO)

## 2023-08-22 DIAGNOSIS — E785 Hyperlipidemia, unspecified: Secondary | ICD-10-CM | POA: Diagnosis not present

## 2023-08-22 LAB — CBC WITH DIFFERENTIAL/PLATELET
Basophils Absolute: 0 10*3/uL (ref 0.0–0.1)
Basophils Relative: 0.9 % (ref 0.0–3.0)
Eosinophils Absolute: 0.1 10*3/uL (ref 0.0–0.7)
Eosinophils Relative: 2.6 % (ref 0.0–5.0)
HCT: 40.9 % (ref 36.0–46.0)
Hemoglobin: 13.5 g/dL (ref 12.0–15.0)
Lymphocytes Relative: 37.7 % (ref 12.0–46.0)
Lymphs Abs: 1.6 10*3/uL (ref 0.7–4.0)
MCHC: 33 g/dL (ref 30.0–36.0)
MCV: 93.1 fL (ref 78.0–100.0)
Monocytes Absolute: 0.4 10*3/uL (ref 0.1–1.0)
Monocytes Relative: 9.6 % (ref 3.0–12.0)
Neutro Abs: 2 10*3/uL (ref 1.4–7.7)
Neutrophils Relative %: 49.2 % (ref 43.0–77.0)
Platelets: 253 10*3/uL (ref 150.0–400.0)
RBC: 4.39 Mil/uL (ref 3.87–5.11)
RDW: 13.5 % (ref 11.5–15.5)
WBC: 4.1 10*3/uL (ref 4.0–10.5)

## 2023-08-22 LAB — BASIC METABOLIC PANEL
BUN: 19 mg/dL (ref 6–23)
CO2: 29 meq/L (ref 19–32)
Calcium: 9.6 mg/dL (ref 8.4–10.5)
Chloride: 104 meq/L (ref 96–112)
Creatinine, Ser: 0.79 mg/dL (ref 0.40–1.20)
GFR: 80.65 mL/min (ref >60.00)
Glucose, Bld: 84 mg/dL (ref 70–99)
Potassium: 4.1 meq/L (ref 3.5–5.1)
Sodium: 140 meq/L (ref 135–145)

## 2023-08-22 LAB — LIPID PANEL
Cholesterol: 209 mg/dL — ABNORMAL HIGH (ref 0–200)
HDL: 64.7 mg/dL (ref >39.00)
LDL Cholesterol: 130 mg/dL — ABNORMAL HIGH (ref 0–99)
NonHDL: 144.2
Total CHOL/HDL Ratio: 3
Triglycerides: 70 mg/dL (ref 0.0–149.0)
VLDL: 14 mg/dL (ref 0.0–40.0)

## 2023-08-22 LAB — TSH: TSH: 2.3 u[IU]/mL (ref 0.35–5.50)

## 2023-08-29 ENCOUNTER — Encounter: Payer: Self-pay | Admitting: Internal Medicine

## 2023-08-29 ENCOUNTER — Ambulatory Visit: Payer: Managed Care, Other (non HMO) | Admitting: Internal Medicine

## 2023-08-29 VITALS — BP 116/78 | HR 71 | Temp 98.1°F | Resp 16 | Ht 62.0 in | Wt 120.0 lb

## 2023-08-29 DIAGNOSIS — Z0000 Encounter for general adult medical examination without abnormal findings: Secondary | ICD-10-CM | POA: Diagnosis not present

## 2023-08-29 DIAGNOSIS — M1909 Primary osteoarthritis, other specified site: Secondary | ICD-10-CM | POA: Diagnosis not present

## 2023-08-29 DIAGNOSIS — Z8249 Family history of ischemic heart disease and other diseases of the circulatory system: Secondary | ICD-10-CM

## 2023-08-29 DIAGNOSIS — Z0001 Encounter for general adult medical examination with abnormal findings: Secondary | ICD-10-CM

## 2023-08-29 MED ORDER — OMEGA-3-ACID ETHYL ESTERS 1 G PO CAPS: 1.0000 | ORAL_CAPSULE | Freq: Every day | ORAL | Status: DC

## 2023-08-29 NOTE — Assessment & Plan Note (Signed)
Here for CPX Family history of CAD:  On no cholesterol meds (Lovaza Rx for hand arthritis).   Cardiovascular risk is 2.8% however her mother had 4 stents between age 61 and 21 years old. Stephanny is asx. EKG today: Low voltage,  Poor R wave progression, similar to older EKGs We agreed on pursue a coronary calcium score for further assessment. DJD: On Lovaza as described above. RTC 1 year

## 2023-08-29 NOTE — Patient Instructions (Addendum)
Vaccines I recommend: Covid booster RSV vaccine   Will arrange for a coronary calcium score.  If you do not hear from them in the next few days let me know.  Next visit with me in 1 year for another physical exam. Please schedule it at the front desk      "Health Care Power of attorney" ,  "Living will" (Advance care planning documents)  If you already have a living will or healthcare power of attorney, is recommended you bring the copy to be scanned in your chart.   The document will be available to all the doctors you see in the system.  Advance care planning is a process that supports adults in  understanding and sharing their preferences regarding future medical care.  The patient's preferences are recorded in documents called Advance Directives and the can be modified at any time while the patient is in full mental capacity.   If you don't have one, please consider create one.      More information at: [email or web address]

## 2023-08-29 NOTE — Assessment & Plan Note (Signed)
Here for CPX -Td 2015 -shingrix x 2 - flu shot at work  - Rec Covid and RSV.  pro>cons  -Female care: per gyn,  had a MMG 2024 per pt   - DEXAs at gyn, per pt  -CCS: Colonoscopy 02-2011, cscope 07-01-21, next  per GI. - recent labs reviewed in detail -POA: Info provided

## 2023-08-29 NOTE — Progress Notes (Signed)
Subjective:    Patient ID: Heather Collins, female    DOB: 10/05/1961, 62 y.o.   MRN: [id number]  DOS:  08/29/2023 Type of visit - description: CPX  Has no major concerns. Feeling well.   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 [employer]  Tobacco Use   Smoking status: Former    Current packs/day: 0.00    Average packs/day: 0.3 packs/day for 4.0 years (1.0 ttl pk-yrs)    Types: Cigarettes    Start date: 11/27/1989    Quit date: 11/27/1993    Years since quitting: 29.7    Passive exposure: Never   Smokeless tobacco: Never   Tobacco comments:    quit in the 90s  Vaping Use   Vaping status: Never Used   Substance and Sexual Activity   Alcohol use: No   Drug use: Never   Sexual activity: Not on file  Other Topics Concern   Not on file  Social History Narrative   Son, 1997, going to college to graduate 10-2021   Lives with husband   Social Determinants of Health   Financial Resource Strain: Low Risk  (03/09/2023)   Overall Financial Resource Strain (CARDIA)    Difficulty of Paying Living Expenses: Not hard at all  Food Insecurity: No Food Insecurity (03/09/2023)   Hunger Vital Sign    Worried About Running Out of Food in the Last Year: Never true    Ran Out of Food in the Last Year: Never true  Transportation Needs: No Transportation Needs (03/09/2023)   PRAPARE - Administrator, Civil Service (Medical): No    Lack of Transportation (Non-Medical): No  Physical Activity: Sufficiently Active (03/09/2023)   Exercise Vital Sign    Days of Exercise per Week: 7 days    Minutes of Exercise per Session: 30 min  Stress: No Stress Concern Present (03/09/2023)   [employer] of Occupational Health - Occupational Stress Questionnaire    Feeling of Stress : Not at all  Social Connections: Unknown (03/09/2023)   Social Connection and Isolation Panel [NHANES]    Frequency of Communication with Friends and  Family: More than three times a week    Frequency of Social Gatherings with Friends and Family: More than three times a week    Attends Religious Services: Patient declined    Database administrator or Organizations: Patient declined    Attends Engineer, structural: Not on file    Marital Status: Married  Catering manager Violence: Not on file     Current Outpatient Medications  Medication Instructions   b complex vitamins capsule 1 capsule, Oral, Daily   cetirizine (ZYRTEC) 10 mg, Oral, Daily   Glucosamine HCl (GLUCOSAMINE PO) Oral   omega-3 acid ethyl esters (LOVAZA) 1 g, Oral, Daily   Omeprazole Magnesium (PRILOSEC PO) Oral   sodium fluoride-calcium carbonate  (FLORICAL) 8.3-364 MG CAPS capsule Florical 3.75 mg (8.25)-145 mg (364) capsule   TART CHERRY PO Oral   TURMERIC PO 1 tablet, Oral, Daily       Objective:   Physical Exam BP 116/78   Pulse 71   Temp 98.1 F (36.7 C) (Oral)   Resp 16   Ht 5\' 2"  (1.575 m)   Wt 120 lb (54.4 kg)   LMP 12/04/2017 (Approximate)   SpO2 98%   BMI 21.95 kg/m  General: Well developed, NAD, BMI noted Neck: No  thyromegaly  HEENT:  Normocephalic . Face symmetric, atraumatic Lungs:  CTA B Normal respiratory effort, no intercostal retractions, no accessory muscle use. Heart: RRR,  no murmur.  Abdomen:  Not distended, soft, non-tender. No rebound or rigidity.   Lower extremities: no pretibial edema bilaterally  Skin: Exposed areas without rash. Not pale. Not jaundice Neurologic:  alert & oriented X3.  Speech normal, gait appropriate for age and unassisted Strength symmetric and appropriate for age.  Psych: Cognition and judgment appear intact.  Cooperative with normal attention span and concentration.  Behavior appropriate. No anxious or depressed appearing.     Assessment    Assessment  GERD Dyslipidemia DJD Menopausal, hysterectomy for abnormal Paps 11-2019, cervix pathology acute and chronic cervicitis RLS HOH , on Florical to prevent calcium deposits in the middle ear EGD 02-2015 wnl, BX negative Migraine headaches- resolved after hysterectomy  PLAN Here for CPX -Td 2015 -shingrix x 2 - flu shot at work  - Rec Covid and RSV.  pro>cons  -Female care: per gyn,  had a MMG 2024 per pt   - DEXAs at gyn, per pt  -CCS: Colonoscopy 02-2011, cscope 07-01-21, next  per GI. - recent labs reviewed in detail -POA: Info provided Family history of CAD:  On no cholesterol meds (Lovaza Rx for hand arthritis).   Cardiovascular risk is 2.8% however her mother had 4 stents between age 2 and 68 years old. Heather Collins is asx. EKG today: Low voltage,  Poor R wave progression, similar to older EKGs We  agreed on pursue a coronary calcium score for further assessment. DJD: On Lovaza as described above. RTC 1 year

## 2023-08-31 ENCOUNTER — Encounter: Payer: Self-pay | Admitting: Internal Medicine

## 2023-09-02 ENCOUNTER — Ambulatory Visit (HOSPITAL_BASED_OUTPATIENT_CLINIC_OR_DEPARTMENT_OTHER)
Admission: RE | Admit: 2023-09-02 | Discharge: 2023-09-02 | Disposition: A | Discharge status: Home / Self Care | Payer: Self-pay | Source: Ambulatory Visit | Attending: Internal Medicine | Admitting: Internal Medicine

## 2023-09-02 ENCOUNTER — Encounter: Payer: Self-pay | Admitting: Internal Medicine

## 2023-09-02 DIAGNOSIS — Z8249 Family history of ischemic heart disease and other diseases of the circulatory system: Secondary | ICD-10-CM | POA: Insufficient documentation

## 2023-09-26 ENCOUNTER — Telehealth: Payer: Self-pay | Admitting: Internal Medicine

## 2023-09-26 ENCOUNTER — Telehealth: Payer: Self-pay

## 2023-09-26 DIAGNOSIS — R9389 Abnormal findings on diagnostic imaging of other specified body structures: Secondary | ICD-10-CM

## 2023-09-26 MED ORDER — AZITHROMYCIN 250 MG PO TABS: ORAL_TABLET | ORAL | 0 refills | Status: DC

## 2023-09-26 NOTE — Telephone Encounter (Signed)
Results noted, LMOM asked for a call back

## 2023-09-26 NOTE — Telephone Encounter (Signed)
Noted, pulm referral placed.

## 2023-09-26 NOTE — Telephone Encounter (Signed)
Pt said she received a message from provider requesting the address for a pharmacy in [place] where she is at. Please send zpack to [address] Grayce Sessions MD // [phone]

## 2023-09-26 NOTE — Telephone Encounter (Signed)
Heather Collins received call from [facility]- they wanted to make provider aware of below.   IMPRESSION: 1. Clustered micro and macronodularity in the right middle lobe, potentially acute bronchopneumonia. Follow-up noncontrast chest CT is recommended in 1 month after trial of antimicrobial therapy to ensure regression of this finding and exclude underlying malignancy. 2. Aortic Atherosclerosis (ICD10-I70.0).   These results will be called to the ordering clinician or representative by the Radiologist Assistant, and communication documented in the PACS or [employer].

## 2023-09-26 NOTE — Addendum Note (Signed)
Addended byConrad [place] D on: 09/26/2023 02:14 PM   Modules accepted: Orders

## 2023-09-26 NOTE — Telephone Encounter (Signed)
Rx sent 

## 2023-09-26 NOTE — Telephone Encounter (Signed)
Coronary calcium score: Normal coronaries, some aortic sclerosis. + Cluster of micro and macro nodules at the right middle lobe. Patient does not recall having pneumonia before, currently with no symptoms. Plan: Pulmonary referral. Send Zithromax.  She will let us know what pharmacy

## 2023-10-31 ENCOUNTER — Ambulatory Visit: Payer: Self-pay

## 2023-10-31 ENCOUNTER — Ambulatory Visit
Admission: RE | Admit: 2023-10-31 | Discharge: 2023-10-31 | Disposition: A | Discharge status: Home / Self Care | Payer: Managed Care, Other (non HMO) | Source: Ambulatory Visit | Attending: Family Medicine | Admitting: Family Medicine

## 2023-10-31 VITALS — BP 107/71 | HR 80 | Temp 98.1°F | Resp 16

## 2023-10-31 DIAGNOSIS — J0111 Acute recurrent frontal sinusitis: Secondary | ICD-10-CM

## 2023-10-31 MED ORDER — AMOXICILLIN-POT CLAVULANATE 875-125 MG PO TABS: 1.0000 | ORAL_TABLET | Freq: Two times a day (BID) | ORAL | 0 refills | Status: AC

## 2023-10-31 NOTE — ED Triage Notes (Signed)
Pt presents to UC for c/o sinus pressure and dental pain since yesterday. Advil sinus and cold does not help

## 2023-10-31 NOTE — ED Provider Notes (Signed)
[facility]    CSN: [id number] Arrival date & time: 10/31/23  6644      History   Chief Complaint Chief Complaint  Patient presents with   Cough    Entered by patient    HPI Heather Collins is a 62 y.o. female.    Cough Patient is here for cough, but also with sinus pain, pressure, dental pain.  Symptoms started about 4-5 days ago.  Tried otc medications without help.  She does have h/o sinus infections.        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   Abnormal Pap smear of cervix 11/29/2019   Status post total hysterectomy 11/29/2019   History of stapedectomy 09/11/2019   Otosclerosis involving oval window, nonobliterative, bilateral 09/11/2019   Annual physical exam 05/18/2017   PCP NOTES >>>>>>>>>>>>>> 05/18/2017   Dyslipidemia 04/11/2012   CARPAL TUNNEL SYNDROME 01/01/2011   RESTLESS LEG SYNDROME 11/25/2007   Migraine headache 11/25/2007   G E R D 05/31/2007    Past Surgical History:  Procedure Laterality Date   COLONOSCOPY     LIPOMA EXCISION  12/05/2020   x2   ROBOTIC ASSISTED LAPAROSCOPIC HYSTERECTOMY AND SALPINGECTOMY Bilateral 11/29/2019   Procedure: XI ROBOTIC ASSISTED LAPAROSCOPIC HYSTERECTOMY AND SALPINGECTOMY;  Surgeon: Maxie Better, MD;  Location: [facility];  Service: Gynecology;  Laterality: Bilateral;   STAPEDECTOMY Left 2016   Dr. Jac Canavan, with a 4.0 mm titanium Roxan Hockey prosthesis with a large well and narrow shaft over a vein graft    STAPEDECTOMY Right 2008   UPPER GASTROINTESTINAL ENDOSCOPY      OB History   No obstetric history on file.      Home Medications    Prior to Admission medications   Medication Sig Start Date End Date Taking? Authorizing Provider  azithromycin (ZITHROMAX) 250 MG tablet Take 2 tablets by mouth first day, then 1 tablet for 4 additional days 09/26/23   Wanda Plump, MD  b complex vitamins capsule Take 1 capsule by mouth daily.    [provider]  cetirizine (ZYRTEC) 10 MG tablet Take 10 mg by mouth daily.    [provider]  Glucosamine HCl (GLUCOSAMINE PO) Take by mouth.    [provider]  omega-3 acid ethyl esters (LOVAZA) 1 g capsule Take 1 capsule (1 g total) by mouth daily. 08/29/23   Wanda Plump, MD  Omeprazole Magnesium (PRILOSEC PO) Take by mouth.    [provider]  sodium fluoride-calcium carbonate (FLORICAL) 8.3-364 MG CAPS capsule Florical 3.75 mg (8.25)-145 mg (364) capsule    [provider]  TART CHERRY PO Take by mouth.    [provider]  TURMERIC PO Take 1 tablet by mouth daily.     [provider]    Family History Family History  Problem Relation Age of Onset   Heart disease Mother 101       at age 66   Arthritis Mother  Lung cancer Father    Diabetes Sister    Healthy Brother    Healthy Brother    Coronary artery disease Paternal Uncle    Healthy Son    Colon cancer Neg Hx    Breast cancer Neg Hx    Colon polyps Neg Hx    Esophageal cancer Neg Hx    Stomach cancer Neg Hx    Rectal cancer Neg Hx     Social History Social History   Tobacco Use   Smoking status: Former    Current packs/day: 0.00    Average packs/day: 0.3 packs/day for 4.0 years (1.0 ttl pk-yrs)    Types: Cigarettes    Start date: 11/27/1989    Quit date: 11/27/1993    Years since quitting: 29.9    Passive exposure: Never   Smokeless tobacco: Never   Tobacco comments:    quit in the 90s  Vaping Use   Vaping status:  Never Used  Substance Use Topics   Alcohol use: No   Drug use: Never     Allergies   Sulfa antibiotics   Review of Systems Review of Systems  Constitutional: Negative.   HENT:  Positive for congestion, sinus pressure and sinus pain.   Respiratory:  Positive for cough.   Gastrointestinal: Negative.   Musculoskeletal: Negative.   Psychiatric/Behavioral: Negative.       Physical Exam Triage Vital Signs ED Triage Vitals  Encounter Vitals Group     BP 10/31/23 0928 107/71     Systolic BP Percentile --      Diastolic BP Percentile --      Pulse Rate 10/31/23 0928 80     Resp 10/31/23 0928 16     Temp 10/31/23 0928 98.1 F (36.7 C)     Temp Source 10/31/23 0928 Oral     SpO2 --      Weight --      Height --      Head Circumference --      Peak Flow --      Pain Score 10/31/23 0927 8     Pain Loc --      Pain Education --      Exclude from Growth Chart --    No data found.  Updated Vital Signs BP 107/71 (BP Location: Left Arm)   Pulse 80   Temp 98.1 F (36.7 C) (Oral)   Resp 16   LMP 12/04/2017 (Approximate)   Visual Acuity Right Eye Distance:   Left Eye Distance:   Bilateral Distance:    Right Eye Near:   Left Eye Near:    Bilateral Near:     Physical Exam Constitutional:      Appearance: Normal appearance.  HENT:     Nose:     Right Sinus: Maxillary sinus tenderness present.     Left Sinus: Maxillary sinus tenderness present.     Mouth/Throat:     Mouth: Mucous membranes are moist.  Cardiovascular:     Rate and Rhythm: Normal rate and regular rhythm.  Pulmonary:     Effort: Pulmonary effort is normal.     Breath sounds: Normal breath sounds.  Musculoskeletal:     Cervical back: Normal range of motion and neck supple. No tenderness.  Neurological:     General: No focal deficit present.     Mental Status: She is alert.  Psychiatric:        Mood and Affect: Mood normal.      UC Treatments /  Results  Labs (all labs ordered are listed,  but only abnormal results are displayed) Labs Reviewed - No data to display  EKG   Radiology No results found.  Procedures Procedures (including critical care time)  Medications Ordered in UC Medications - No data to display  Initial Impression / Assessment and Plan / UC Course  I have reviewed the triage vital signs and the nursing notes.  Pertinent labs & imaging results that were available during my care of the patient were reviewed by me and considered in my medical decision making (see chart for details).  Final Clinical Impressions(s) / UC Diagnoses   Final diagnoses:  Acute recurrent frontal sinusitis     Discharge Instructions      You were diagnosed with a sinus infection today.  I have sent out augmentin to take twice/day x 10 days.  Please continue over the counter medications as needed for symptoms.  Return if not improving.     ED Prescriptions     Medication Sig Dispense Auth. Provider   amoxicillin-clavulanate (AUGMENTIN) 875-125 MG tablet Take 1 tablet by mouth every 12 (twelve) hours for 10 days. 20 tablet Jannifer Franklin, MD      PDMP not reviewed this encounter.   Jannifer Franklin, MD 10/31/23 [phone]

## 2023-10-31 NOTE — Discharge Instructions (Signed)
You were diagnosed with a sinus infection today.  I have sent out augmentin to take twice/day x 10 days.  Please continue over the counter medications as needed for symptoms.  Return if not improving.

## 2023-11-17 ENCOUNTER — Encounter (INDEPENDENT_AMBULATORY_CARE_PROVIDER_SITE_OTHER): Payer: Self-pay | Admitting: Otolaryngology

## 2023-11-23 ENCOUNTER — Encounter: Payer: Self-pay | Admitting: Pulmonary Disease

## 2023-11-23 ENCOUNTER — Ambulatory Visit: Payer: Managed Care, Other (non HMO) | Admitting: Pulmonary Disease

## 2023-11-23 ENCOUNTER — Other Ambulatory Visit: Payer: Self-pay | Admitting: Physician Assistant

## 2023-11-23 VITALS — BP 101/68 | HR 69 | Ht 62.5 in | Wt 118.2 lb

## 2023-11-23 DIAGNOSIS — R918 Other nonspecific abnormal finding of lung field: Secondary | ICD-10-CM | POA: Diagnosis not present

## 2023-11-23 NOTE — Telephone Encounter (Signed)
 Last Fill: 09/13/2022  Next Visit: 02/24/2024  Last Visit: 02/24/2023  Current Dose per office note on 02/24/2023: not discussed   Okay to refill Lovaza ?

## 2023-11-23 NOTE — Progress Notes (Signed)
 Synopsis: Referred in January 2025 for pulmonary nodule, infiltrate by Ezell Hollow, MD  Subjective:   PATIENT ID: Heather Collins GENDER: female DOB: April 28, 1961, MRN: [id number]  Chief Complaint  Patient presents with   Consult    Ct 09/02/23, no symptoms. Denies any other concern     This is a 63 year old female that has a past medical history of RLS arthritis carpal tunnel, reflux hiatal hernia.  Was up in [place] after receiving a calcium scoring CT that found incidental right middle lobe infiltrate and nodularity.  She had cough at that time.  She was treated with azithromycin .  She has not had a subsequent CT image follow-up at this time.  Here today for evaluation of abnormal CT imaging.    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    Otosclerosis involving oval window, nonobliterative    Bilateral   Restless legs syndrome (RLS)      Family History  Problem Relation Age of Onset   Heart disease Mother 35       at age 59   Arthritis Mother    Lung cancer Father    Diabetes Sister    Healthy Brother    Healthy Brother    Coronary artery disease Paternal Uncle    Healthy Son    Colon cancer Neg Hx    Breast cancer Neg Hx    Colon polyps Neg Hx    Esophageal cancer Neg Hx    Stomach cancer Neg Hx    Rectal cancer Neg Hx      Past Surgical History:  Procedure Laterality Date   COLONOSCOPY     LIPOMA EXCISION  12/05/2020   x2   ROBOTIC ASSISTED LAPAROSCOPIC HYSTERECTOMY AND SALPINGECTOMY Bilateral 11/29/2019   Procedure: XI ROBOTIC ASSISTED LAPAROSCOPIC HYSTERECTOMY AND SALPINGECTOMY;  Surgeon: Ivery Marking, MD;  Location: [facility];  Service: Gynecology;   Laterality: Bilateral;   STAPEDECTOMY Left 2016   Dr. 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 [employer]  Tobacco Use   Smoking status: Former    Current packs/day: 0.00    Average packs/day: 0.3 packs/day for 4.0 years (1.0 ttl pk-yrs)    Types: Cigarettes    Start date: 11/27/1989    Quit date: 11/27/1993    Years since quitting: 30.0    Passive exposure: Never   Smokeless tobacco: Never   Tobacco comments:    quit in the 90s  Vaping Use   Vaping status: Never Used  Substance and Sexual Activity   Alcohol use: No   Drug use: Never   Sexual activity: Not on file  Other Topics Concern   Not on file  Social History Narrative   Son, 1997, going to college to graduate 10-2021  Lives with husband   Social Drivers of Corporate investment banker Strain: Low Risk  (03/09/2023)   Overall Financial Resource Strain (CARDIA)    Difficulty of Paying Living Expenses: Not hard at all  Food Insecurity: No Food Insecurity (03/09/2023)   Hunger Vital Sign    Worried About Running Out of Food in the Last Year: Never true    Ran Out of Food in the Last Year: Never true  Transportation Needs: No Transportation Needs (03/09/2023)   PRAPARE - Administrator, Civil Service (Medical): No    Lack of Transportation (Non-Medical): No  Physical Activity: Sufficiently Active (03/09/2023)   Exercise Vital Sign    Days of Exercise per Week: 7 days    Minutes of Exercise per Session: 30 min  Stress: No Stress Concern Present (03/09/2023)   [employer] of Occupational Health - Occupational Stress Questionnaire    Feeling  of Stress : Not at all  Social Connections: Unknown (03/09/2023)   Social Connection and Isolation Panel [NHANES]    Frequency of Communication with Friends and Family: More than three times a week    Frequency of Social Gatherings with Friends and Family: More than three times a week    Attends Religious Services: Patient declined    Database administrator or Organizations: Patient declined    Attends Engineer, structural: Not on file    Marital Status: Married  Catering manager Violence: Not on file     Allergies  Allergen Reactions   Sulfa Antibiotics      Outpatient Medications Prior to Visit  Medication Sig Dispense Refill   b complex vitamins capsule Take 1 capsule by mouth daily.     cetirizine (ZYRTEC) 10 MG tablet Take 10 mg by mouth daily.     Glucosamine HCl (GLUCOSAMINE PO) Take by mouth.     omega-3 acid ethyl esters (LOVAZA ) 1 g capsule TAKE 1 CAPSULE TWICE A DAY 180 capsule 3   Omeprazole Magnesium (PRILOSEC PO) Take by mouth.     sodium fluoride-calcium carbonate (FLORICAL) 8.3-364 MG CAPS capsule Florical 3.75 mg (8.25)-145 mg (364) capsule     TART CHERRY PO Take by mouth.     TURMERIC PO Take 1 tablet by mouth daily.      No facility-administered medications prior to visit.    Review of Systems  Constitutional:  Negative for chills, fever, malaise/fatigue and weight loss.  HENT:  Negative for hearing loss, sore throat and tinnitus.   Eyes:  Negative for blurred vision and double vision.  Respiratory:  Negative for cough, hemoptysis, sputum production, shortness of breath, wheezing and stridor.   Cardiovascular:  Negative for chest pain, palpitations, orthopnea, leg swelling and PND.  Gastrointestinal:  Negative for abdominal pain, constipation, diarrhea, heartburn, nausea and vomiting.  Genitourinary:  Negative for dysuria, hematuria and urgency.  Musculoskeletal:  Negative for joint pain and myalgias.  Skin:  Negative for itching and rash.   Neurological:  Negative for dizziness, tingling, weakness and headaches.  Endo/Heme/Allergies:  Negative for environmental allergies. Does not bruise/bleed easily.  Psychiatric/Behavioral:  Negative for depression. The patient is not nervous/anxious and does not have insomnia.   All other systems reviewed and are negative.    Objective:  Physical Exam Vitals reviewed.  Constitutional:      General: She is not in acute distress.    Appearance: She is well-developed.  HENT:     Head: Normocephalic and atraumatic.  Eyes:  General: No scleral icterus.    Conjunctiva/sclera: Conjunctivae normal.     Pupils: Pupils are equal, round, and reactive to light.  Neck:     Vascular: No JVD.     Trachea: No tracheal deviation.  Cardiovascular:     Rate and Rhythm: Normal rate and regular rhythm.     Heart sounds: Normal heart sounds. No murmur heard. Pulmonary:     Effort: Pulmonary effort is normal. No tachypnea, accessory muscle usage or respiratory distress.     Breath sounds: No stridor. No wheezing, rhonchi or rales.  Abdominal:     General: There is no distension.     Palpations: Abdomen is soft.     Tenderness: There is no abdominal tenderness.  Musculoskeletal:        General: No tenderness.     Cervical back: Neck supple.  Lymphadenopathy:     Cervical: No cervical adenopathy.  Skin:    General: Skin is warm and dry.     Capillary Refill: Capillary refill takes less than 2 seconds.     Findings: No rash.  Neurological:     Mental Status: She is alert and oriented to person, place, and time.  Psychiatric:        Behavior: Behavior normal.      Vitals:   11/23/23 1412  BP: 101/68  Pulse: 69  SpO2: 96%  Weight: 118 lb 3.2 oz (53.6 kg)  Height: 5' 2.5" (1.588 m)   96% on RA BMI Readings from Last 3 Encounters:  11/23/23 21.27 kg/m  08/29/23 21.95 kg/m  03/09/23 22.86 kg/m   Wt Readings from Last 3 Encounters:  11/23/23 118 lb 3.2 oz (53.6 kg)  08/29/23  120 lb (54.4 kg)  03/09/23 125 lb (56.7 kg)     CBC    Component Value Date/Time   WBC 4.1 08/22/2023 0717   RBC 4.39 08/22/2023 0717   HGB 13.5 08/22/2023 0717   HCT 40.9 08/22/2023 0717   PLT 253.0 08/22/2023 0717   MCV 93.1 08/22/2023 0717   MCH 31.3 07/21/2020 0837   MCHC 33.0 08/22/2023 0717   RDW 13.5 08/22/2023 0717   LYMPHSABS 1.6 08/22/2023 0717   MONOABS 0.4 08/22/2023 0717   EOSABS 0.1 08/22/2023 0717   BASOSABS 0.0 08/22/2023 0717     Chest Imaging:  Calcium scoring CT October 2024: Right middle lobe infiltrate, areas of nodularity. The patient's images have been independently reviewed by me.    Pulmonary Functions Testing Results:     No data to display          FeNO:   Pathology:   Echocardiogram:   Heart Catheterization:     Assessment & Plan:     ICD-10-CM   1. Pulmonary infiltrate  R91.8 CT Chest Wo Contrast      Discussion:  This is a 63 year old female found to have a right middle lobe nodular infiltrate likely infectious or inflammatory in etiology.  We need CT imaging to improve resolution.  Plan: We will have a repeat noncontrasted CT scan of the chest. Pending results of the CT we will recommend further steps. If the lesion is still present I think that there needs to be additional workup even to consider bronchoscopy.  We talked about this today in the office. Patient is agreeable to the plan. Repeat noncontrast CT in return to clinic as a virtual visit to discuss results.   Current Outpatient Medications:    b complex vitamins capsule, Take 1 capsule by mouth  daily., Disp: , Rfl:    cetirizine (ZYRTEC) 10 MG tablet, Take 10 mg by mouth daily., Disp: , Rfl:    Glucosamine HCl (GLUCOSAMINE PO), Take by mouth., Disp: , Rfl:    omega-3 acid ethyl esters (LOVAZA ) 1 g capsule, TAKE 1 CAPSULE TWICE A DAY, Disp: 180 capsule, Rfl: 3   Omeprazole Magnesium (PRILOSEC PO), Take by mouth., Disp: , Rfl:    sodium fluoride-calcium  carbonate (FLORICAL) 8.3-364 MG CAPS capsule, Florical 3.75 mg (8.25)-145 mg (364) capsule, Disp: , Rfl:    TART CHERRY PO, Take by mouth., Disp: , Rfl:    TURMERIC PO, Take 1 tablet by mouth daily. , Disp: , Rfl:    Prudy Brownie, DO [facility] 11/23/2023 2:47 PM

## 2023-11-23 NOTE — Patient Instructions (Signed)
 Thank you for visiting Dr. Thelda Finney at [facility]. Today we recommend the following:  Orders Placed This Encounter  Procedures   CT Chest Wo Contrast   Return in about 3 weeks (around 12/14/2023) for with APP, after CT Chest as a virtual visit .    Please do your part to reduce the spread of COVID-19.

## 2023-11-24 ENCOUNTER — Encounter (INDEPENDENT_AMBULATORY_CARE_PROVIDER_SITE_OTHER): Payer: Self-pay | Admitting: Otolaryngology

## 2023-12-15 ENCOUNTER — Other Ambulatory Visit: Payer: Managed Care, Other (non HMO)

## 2023-12-15 ENCOUNTER — Ambulatory Visit
Admission: RE | Admit: 2023-12-15 | Discharge: 2023-12-15 | Disposition: A | Discharge status: Home / Self Care | Payer: Managed Care, Other (non HMO) | Source: Ambulatory Visit | Attending: Pulmonary Disease | Admitting: Pulmonary Disease

## 2023-12-15 DIAGNOSIS — R918 Other nonspecific abnormal finding of lung field: Secondary | ICD-10-CM

## 2023-12-23 ENCOUNTER — Encounter: Payer: Self-pay | Admitting: Adult Health

## 2023-12-23 ENCOUNTER — Telehealth: Payer: Managed Care, Other (non HMO) | Admitting: Adult Health

## 2023-12-23 DIAGNOSIS — R918 Other nonspecific abnormal finding of lung field: Secondary | ICD-10-CM | POA: Diagnosis not present

## 2023-12-23 NOTE — Patient Instructions (Signed)
Set up CT chest in 6 months Follow-up with Dr. Delton Coombes in 6 months after CT chest-30-minute slot

## 2023-12-23 NOTE — Progress Notes (Signed)
Virtual Visit via Video Note  I connected with Heather Collins on 12/23/23 at  4:00 PM EST by a video enabled telemedicine application and verified that I am speaking with the correct person using two identifiers.  Location: Patient: Home  Provider: Office    I discussed the limitations of evaluation and management by telemedicine and the availability of in person appointments. The patient expressed understanding and agreed to proceed.  History of Present Illness: 63 year old female former smoker seen for pulmonary consult November 23, 2023 for abnormal CT chest with right middle lobe infiltrate and nodularity  Today's video visit is a 1 month follow-up.  Patient was seen last month for a pulmonary consult with Dr. Tonia Brooms for an abnormal CT chest.  Patient had a cardiac CT that showed an incidental finding of a right middle lobe infiltrate and nodularity.  She did have acute respiratory symptoms with cough and congestion.  She was given a Z-Pak.  She was set up for a repeat CT chest that was completed on December 15, 2023 that showed no acute infiltrates or consolidation.  Previously described infiltrate and parenchymal changes of the right middle lobe have completely disappeared.  Chronic biapical atelectatic changes in the right apex and left apex nodular density, incidental finding of a 2.5 cystic right lobe of the liver lesion considered a lung RADS 3 with recommendations for a follow-up CT in 6 months Very active, exercising daily . No dyspnea. No cough or wheezing . Feels good.   Patient has no history of cancer.  She is a former smoker that quit in 1995 with minimum smoking history.  Denies any known history of asthma or COPD.  Family history of lung cancer with Father.  No history pneumonia or chronic bronchitis .  Has chronic allergies.  From [place] . No travel. No birds or chickens. No hottub or basement .  No auto immune disease. No macrodantin , amiodarone or methrotrexate. No dysphagia . Has  controlled on GERD on PPI.    She denies any hemoptysis or unintentional weight loss.  Had some technical difficulties switched to phone to help with audio.   Observations/Objective: Appears well in no acute distress  Assessment and Plan: Lung nodularity-initial right middle lobe lung nodule and infiltrates resolved after antibiotic course.  Most likely infectious in nature.  Patient does have some chronic biapical nodularity that will need ongoing surveillance.  Will set up CT chest in 6 months.  Patient is essentially symptom free.  She is very active exercises every day.  Has no known lung problems no pulmonary risk factors.  Questionable etiology for parenchymal changes-possibly secondary to chronic sinus drainage plus or minus reflux.  Patient is continue on aggressive maintenance regimen for triggers of chronic rhinitis and GERD.  As above we will repeat CT chest in 6 months.  Incidental finding of cystic liver lesion measuring 2.5 cm.  Discussed in detail need follow-up with primary care to determine if ongoing workup is indicated with possible hepatic ultrasound.  Plan  Check CT chest in 6 months with follow up    Follow Up Instructions:    I discussed the assessment and treatment plan with the patient. The patient was provided an opportunity to ask questions and all were answered. The patient agreed with the plan and demonstrated an understanding of the instructions.   The patient was advised to call back or seek an in-person evaluation if the symptoms worsen or if the condition fails to improve as anticipated.  I provided 30  minutes of non-face-to-face time during this encounter.   Rubye Oaks, NP

## 2023-12-26 ENCOUNTER — Telehealth: Payer: Self-pay | Admitting: Internal Medicine

## 2023-12-26 NOTE — Telephone Encounter (Signed)
 Appt scheduled for Friday at 2:40. Pt needs virtual because she is caring for her mother after a fall. Pt's video option does not work on Clinical cytogeneticist so she is requesting a phone call to speak with Dr.Paz

## 2023-12-26 NOTE — Telephone Encounter (Signed)
 We are unable to bill for appts done by just telephone call. She will need to come in person or find a way to do virtual visit.

## 2023-12-26 NOTE — Telephone Encounter (Unsigned)
 Copied from CRM [id number]. Topic: Clinical - Medical Advice >> Dec 26, 2023  1:15 PM Mosetta Putt H wrote: Reason for CRM: would like dr Drue Novel to look at the ct scans from [facility] there was a cyst on liver and want dr to be aware

## 2023-12-26 NOTE — Telephone Encounter (Signed)
 Needs appt w/ PCP at her convenience.

## 2023-12-27 NOTE — Telephone Encounter (Signed)
 Called pt back and got her scheduled in person on 3/13 AT 4

## 2023-12-30 ENCOUNTER — Telehealth: Payer: Managed Care, Other (non HMO) | Admitting: Internal Medicine

## 2024-01-06 ENCOUNTER — Ambulatory Visit: Payer: Managed Care, Other (non HMO) | Admitting: Internal Medicine

## 2024-01-18 ENCOUNTER — Telehealth (INDEPENDENT_AMBULATORY_CARE_PROVIDER_SITE_OTHER): Payer: Self-pay | Admitting: Otolaryngology

## 2024-01-18 NOTE — Telephone Encounter (Signed)
 Left vm to confirm appt and address for 01/19/2024.

## 2024-01-19 ENCOUNTER — Ambulatory Visit (INDEPENDENT_AMBULATORY_CARE_PROVIDER_SITE_OTHER): Payer: Managed Care, Other (non HMO) | Admitting: Otolaryngology

## 2024-01-19 ENCOUNTER — Encounter (INDEPENDENT_AMBULATORY_CARE_PROVIDER_SITE_OTHER): Payer: Self-pay

## 2024-01-19 VITALS — BP 111/71 | HR 72 | Ht 62.0 in | Wt 120.0 lb

## 2024-01-19 DIAGNOSIS — H906 Mixed conductive and sensorineural hearing loss, bilateral: Secondary | ICD-10-CM

## 2024-01-19 DIAGNOSIS — H8013 Otosclerosis involving oval window, obliterative, bilateral: Secondary | ICD-10-CM

## 2024-01-20 ENCOUNTER — Ambulatory Visit: Payer: Managed Care, Other (non HMO) | Admitting: Internal Medicine

## 2024-01-20 VITALS — BP 110/72 | HR 66 | Temp 98.0°F | Resp 12 | Ht 62.0 in | Wt 119.8 lb

## 2024-01-20 DIAGNOSIS — K7689 Other specified diseases of liver: Secondary | ICD-10-CM

## 2024-01-20 NOTE — Progress Notes (Signed)
 Subjective:    Patient ID: Heather Collins, female    DOB: 30-Jun-1961, 63 y.o.   MRN: [id number]  DOS:  01/20/2024 Type of visit - description: acute  Was found to have liver cyst on CT chest incidentally. She denies any RUQ abdominal pain. No nausea vomiting. Never told before she had a liver problem.  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 Jac Canavan, with a 4.0 mm titanium Roxan Hockey prosthesis with a large well and narrow shaft over a vein graft   STAPEDECTOMY Right 2008   UPPER GASTROINTESTINAL ENDOSCOPY      Current Outpatient Medications  Medication Instructions   b complex vitamins capsule 1 capsule, Daily   cetirizine (ZYRTEC) 10 mg, Daily   Glucosamine HCl (GLUCOSAMINE PO) Take by mouth.   omega-3 acid ethyl esters (LOVAZA) 1 g, Oral, 2 times daily   Omeprazole Magnesium (PRILOSEC PO) Take by mouth.   sodium fluoride-calcium carbonate (FLORICAL) 8.3-364 MG CAPS capsule Florical 3.75 mg (8.25)-145 mg (364) capsule       Objective:   Physical Exam BP 110/72 (BP Location: Right Arm, Patient Position: Sitting, Cuff Size: Normal)   Pulse 66    Temp 98 F (36.7 C) (Oral)   Resp 12   Ht 5\' 2"  (1.575 m)   Wt 119 lb 12.8 oz (54.3 kg)   LMP 12/04/2017 (Approximate)   SpO2 99%   BMI 21.91 kg/m  General:   Well developed, NAD, BMI noted.  HEENT:  Normocephalic . Face symmetric, atraumatic Abdomen:  Not distended, soft, non-tender. No rebound or rigidity.  No organomegaly.   Skin: Not pale. Not jaundice Lower extremities: no pretibial edema bilaterally  Neurologic:  alert & oriented X3.  Speech normal, gait appropriate for age and unassisted Psych--  Cognition and judgment appear intact.  Cooperative with normal attention span and concentration.  Behavior appropriate. No anxious or depressed appearing.     Assessment   Assessment  GERD Dyslipidemia DJD Menopausal, hysterectomy for abnormal Paps 11-2019, cervix pathology acute and chronic cervicitis RLS HOH , on Florical to prevent calcium deposits in the middle ear EGD 02-2015 wnl, BX negative Migraine headaches- resolved after hysterectomy Coronary calcium score 08-2023: 0.   PLAN Right lobe of liver cysts: Incidental finding on CT chest 12-2023, no previous dx of a cyst before.  She is asymptomatic.  Explained patient this is likely benign.  Will proceed w/ an abdominal ultrasound and  for completeness check LFTs. Abnormal CT chest. Had a coronary calcium score October 2024, found a  cluster of nodularities at the RML, subsequently saw pulmonary 11/23/2023, CT redone  12-2023 it show improvement but still has  some abnormalities, see report for details.  Subsequently saw pulmonary and they plan to repeat CT chest around 06-2024. RTC CPX 08-2023

## 2024-01-20 NOTE — Patient Instructions (Signed)
   GO TO THE LAB : Get the blood work     Please go to the front desk: Arrange a physical for 08/2024

## 2024-01-21 LAB — HEPATIC FUNCTION PANEL
AG Ratio: 1.8 (calc) (ref 1.0–2.5)
ALT: 23 U/L (ref 6–29)
AST: 24 U/L (ref 10–35)
Albumin: 4.2 g/dL (ref 3.6–5.1)
Alkaline phosphatase (APISO): 62 U/L (ref 37–153)
Bilirubin, Direct: 0.1 mg/dL (ref 0.0–0.2)
Globulin: 2.3 g/dL (ref 1.9–3.7)
Indirect Bilirubin: 0.4 mg/dL (ref 0.2–1.2)
Total Bilirubin: 0.5 mg/dL (ref 0.2–1.2)
Total Protein: 6.5 g/dL (ref 6.1–8.1)

## 2024-01-21 NOTE — Assessment & Plan Note (Signed)
 Right lobe of liver cysts: Incidental finding on CT chest 12-2023, no previous dx of a cyst before.  She is asymptomatic.  Explained patient this is likely benign.  Will proceed w/ an abdominal ultrasound and  for completeness check LFTs. Abnormal CT chest. Had a coronary calcium score October 2024, found a  cluster of nodularities at the RML, subsequently saw pulmonary 11/23/2023, CT redone  12-2023 it show improvement but still has some abnormalities, see report for details.  Subsequently saw pulmonary and they plan to repeat CT chest around 06-2024. RTC CPX 08-2023

## 2024-01-22 NOTE — Progress Notes (Signed)
 Dear Dr. Dorma Russell, Here is my assessment for our mutual patient, Heather Collins. Thank you for allowing me the opportunity to care for your patient. Please do not hesitate to contact me should you have any other questions. Sincerely, Dr. Jovita Kussmaul  Otolaryngology Clinic Note Referring provider: Dr. Dorma Russell HPI:  Heather Collins is a 63 y.o. female kindly referred by Dr. Dorma Russell for evaluation of bilateral otosclerosis.  Prior patient of Dr. Dorma Russell, with history from him as below: prior right stapedectomy in 2003, then right stapes by Dr. Dorma Russell in 2010, then left stapes 2016. Followed since that point, on florical and Vit D. She was last seen in 2022 without any symptoms. She did not wish for any amplification.   Today, she reports that she continues to do well. No vertigo, otorrhea, otalgia, fullness, ear infections, drainage. She reports that her hearing is about the same. She is overall pleased with how she is doing.  H&N Surgery: multiple stapedectomies Personal or FHx of bleeding dz or anesthesia difficulty: no  Tobacco: quit  PMHx: GERD, Migraine, Otosclerosis, Right middle lobe infiltrate  Independent Review of Additional Tests or Records:  Dr. Donaciano Eva notes [facility] ENT 09/10/2021) reviewed independently and summarized: right stapes x2, left stapes 2016; stable hearing; ref to ENT in [place] for regular visits Discussed wearing HA but declined. F/u 2 years Audio 09/10/2021 (WF) outside audio independently reviewed and interpreted: bilateral mixed hearing loss downsloping to mod-severe AD and severe AS - left > right with SRT 20 dB AD, 35 dB AS, 96% discrim AD, 94% AS at 70 and 85 dB respectively; of note, left ear thresholds decreased from 10-25db at 500 and 1500-3000 Hz AS. 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 Jac Canavan, with a 4.0 mm titanium Roxan Hockey prosthesis with a large well and narrow shaft over a vein graft   STAPEDECTOMY Right 2008   UPPER GASTROINTESTINAL ENDOSCOPY      Family History  Problem Relation Age of Onset   Heart disease Mother 71       at age 50   Arthritis Mother    Lung cancer Father    Diabetes Sister    Healthy Brother    Healthy Brother    Coronary artery disease Paternal Uncle    Healthy Son    Colon cancer Neg Hx    Breast cancer Neg Hx    Colon polyps Neg Hx    Esophageal  cancer Neg Hx    Stomach cancer Neg Hx    Rectal cancer Neg Hx      Social Connections: Unknown (01/13/2024)   Social Connection and Isolation Panel [NHANES]    Frequency of Communication with Friends and Family: More than three times a week    Frequency of Social Gatherings with Friends and Family: More than three times a week    Attends Religious Services: Patient declined    Database administrator or Organizations: Patient declined    Attends Engineer, structural: Not on file     Marital Status: Married      Current Outpatient Medications:    omega-3 acid ethyl esters (LOVAZA) 1 g capsule, TAKE 1 CAPSULE TWICE A DAY, Disp: 180 capsule, Rfl: 3   b complex vitamins capsule, Take 1 capsule by mouth daily. (Patient not taking: Reported on 01/20/2024), Disp: , Rfl:    cetirizine (ZYRTEC) 10 MG tablet, Take 10 mg by mouth daily. (Patient not taking: Reported on 01/19/2024), Disp: , Rfl:    Glucosamine HCl (GLUCOSAMINE PO), Take by mouth. (Patient not taking: Reported on 01/20/2024), Disp: , Rfl:    Omeprazole Magnesium (PRILOSEC PO), Take by mouth. (Patient not taking: Reported on 01/20/2024), Disp: , Rfl:    sodium fluoride-calcium carbonate (FLORICAL) 8.3-364 MG CAPS capsule, Florical 3.75 mg (8.25)-145 mg (364) capsule (Patient not taking: Reported on 01/20/2024), Disp: , Rfl:    Physical Exam:   BP 111/71 (BP Location: Left Arm, Patient Position: Sitting, Cuff Size: Normal)   Pulse 72   Ht 5\' 2"  (1.575 m)   Wt 120 lb (54.4 kg)   LMP 12/04/2017 (Approximate)   SpO2 96%   BMI 21.95 kg/m   Salient findings:  CN II-XII intact Given history and complaints, ear microscopy was indicated and performed for evaluation with findings as below in physical exam section and in procedures; Bilateral EAC clear and TM intact with well pneumatized middle ear spaces Weber 512: mid Rinne 512: AC > BC b/l  Anterior rhinoscopy: Septum relatively midline; bilateral inferior turbinates without significant hypertrophy No lesions of oral cavity/oropharynx No obviously palpable neck masses/lymphadenopathy/thyromegaly No respiratory distress or stridor  Seprately Identifiable Procedures:  Procedure: Bilateral ear microscopy using microscope (CPT [id number]) Pre-procedure diagnosis: otosclerosis (bilateral) Post-procedure diagnosis: same Indication:; given patient's otologic complaints and history, for improved and comprehensive examination of external ear and tympanic membrane, bilateral otologic  examination using microscope was performed  Procedure: Patient was placed semi-recumbent. Both ear canals were examined using the microscope with findings above. Some cerumen removed on left and on right using suction and currette.  Patient tolerated the procedure well.   Impression & Plans:  Heather Collins is a 63 y.o. female with:  1. Otosclerosis involving oval window, obliterative, bilateral   2. Mixed hearing loss, bilateral    Exam is stable today; will obtain baseline audio We discussed continued florical and vit D She can proceed with HA if interested at any point (not currently); will follow up in 2 years to ensure hearing not worsening, sooner if any issues  - f/u 2 years with audio (any time prior)  See below regarding exact medications prescribed this encounter including dosages and route: No orders of the defined types were placed in this encounter.     Thank you for allowing me the opportunity to care for your patient. Please do not hesitate to contact me should you have any other questions.  Sincerely, Jovita Kussmaul, MD Otolaryngologist (ENT), [facility] Phone: [phone] Fax: [phone]  01/29/2024, 10:25 AM   I have personally spent 51 minutes involved in face-to-face and non-face-to-face activities for this patient on the day of the visit.  Professional time spent excludes any procedures performed but includes the following activities, in addition to those noted in the documentation: preparing to see the patient (review of outside documentation and results), performing a medically appropriate examination, counseling, documenting in the electronic health record, independently interpreting results (CT, outside audio).

## 2024-01-24 ENCOUNTER — Encounter: Payer: Self-pay | Admitting: Internal Medicine

## 2024-01-25 NOTE — Progress Notes (Deleted)
 Office Visit Note  Patient: Heather Collins             Date of Birth: 02/18/1961           MRN: [id number]             PCP: Wanda Plump, MD Referring: Wanda Plump, MD Visit Date: 02/08/2024 Occupation: @GUAROCC @  Subjective:  No chief complaint on file.   History of Present Illness: Heather Collins is a 63 y.o. female ***     Activities of Daily Living:  Patient reports morning stiffness for *** {minute/hour:19697}.   Patient {ACTIONS;DENIES/REPORTS:21021675::"Denies"} nocturnal pain.  Difficulty dressing/grooming: {ACTIONS;DENIES/REPORTS:21021675::"Denies"} Difficulty climbing stairs: {ACTIONS;DENIES/REPORTS:21021675::"Denies"} Difficulty getting out of chair: {ACTIONS;DENIES/REPORTS:21021675::"Denies"} Difficulty using hands for taps, buttons, cutlery, and/or writing: {ACTIONS;DENIES/REPORTS:21021675::"Denies"}  No Rheumatology ROS completed.   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   Abnormal Pap smear of cervix 11/29/2019   Status post total hysterectomy 11/29/2019   History of stapedectomy 09/11/2019   Otosclerosis involving oval window, nonobliterative, bilateral 09/11/2019   Annual physical exam 05/18/2017   PCP NOTES >>>>>>>>>>>>>> 05/18/2017   Dyslipidemia 04/11/2012   CARPAL TUNNEL SYNDROME 01/01/2011   RESTLESS LEG SYNDROME 11/25/2007   Migraine headache 11/25/2007   G E R D 05/31/2007    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    Otosclerosis involving oval window, nonobliterative    Bilateral   Restless legs syndrome (RLS)     Family History  Problem Relation Age of Onset   Heart disease Mother 68       at age 55    Arthritis Mother    Lung cancer Father    Diabetes Sister    Healthy Brother    Healthy Brother    Coronary artery disease Paternal Uncle    Healthy Son    Colon cancer Neg Hx    Breast cancer Neg Hx    Colon polyps Neg Hx    Esophageal cancer Neg Hx    Stomach cancer Neg Hx    Rectal cancer Neg Hx    Past Surgical History:  Procedure Laterality Date   COLONOSCOPY     LIPOMA EXCISION  12/05/2020   x2   ROBOTIC ASSISTED LAPAROSCOPIC HYSTERECTOMY AND SALPINGECTOMY Bilateral 11/29/2019   Procedure: XI ROBOTIC ASSISTED LAPAROSCOPIC HYSTERECTOMY AND SALPINGECTOMY;  Surgeon: Maxie Better, MD;  Location: [facility];  Service: Gynecology;  Laterality: Bilateral;   STAPEDECTOMY Left 2016   Dr. Jac Canavan, with a 4.0 mm titanium Roxan Hockey prosthesis with a large well and narrow shaft over a vein graft   STAPEDECTOMY Right 2008   UPPER GASTROINTESTINAL ENDOSCOPY     Social History   Social History Narrative   Son, 1997, going to college to graduate 10-2021   Lives with husband   Immunization History  Administered Date(s) Administered   DTaP 04/04/2013   Influenza Split 08/09/2023   Influenza Whole 09/11/2012   Influenza-Unspecified 08/08/2016, 08/03/2017, 08/07/2019, 08/18/2021   PFIZER(Purple Top)SARS-COV-2 Vaccination 12/26/2019, 01/18/2020, 09/05/2020   Td 11/08/2013   Zoster Recombinant(Shingrix) 07/20/2021, 09/22/2021     Objective: Vital Signs: LMP 12/04/2017 (Approximate)    Physical  Exam   Musculoskeletal Exam: ***  CDAI Exam: CDAI Score: -- Patient Global: --; Provider Global: -- Swollen: --; Tender: -- Joint Exam 02/08/2024   No joint exam has been documented for this visit   There is currently no information documented on the homunculus. Go to the Rheumatology activity and complete the homunculus joint exam.  Investigation: No additional findings.  Imaging: No results found.  Recent Labs: Lab Results  Component Value Date   WBC  4.1 08/22/2023   HGB 13.5 08/22/2023   PLT 253.0 08/22/2023   NA 140 08/22/2023   K 4.1 08/22/2023   CL 104 08/22/2023   CO2 29 08/22/2023   GLUCOSE 84 08/22/2023   BUN 19 08/22/2023   CREATININE 0.79 08/22/2023   BILITOT 0.5 01/20/2024   ALKPHOS 72 07/28/2022   AST 24 01/20/2024   ALT 23 01/20/2024   PROT 6.5 01/20/2024   ALBUMIN 4.0 07/28/2022   CALCIUM 9.6 08/22/2023   GFRAA >60 11/30/2019    Speciality Comments: No specialty comments available.  Procedures:  No procedures performed Allergies: Sulfa antibiotics   Assessment / Plan:     Visit Diagnoses: Primary osteoarthritis of both hands  Primary osteoarthritis of both feet  History of gastroesophageal reflux (GERD)  Dyslipidemia  RLS (restless legs syndrome)  Hx of migraines  Orders: No orders of the defined types were placed in this encounter.  No orders of the defined types were placed in this encounter.   Face-to-face time spent with patient was *** minutes. Greater than 50% of time was spent in counseling and coordination of care.  Follow-Up Instructions: No follow-ups on file.   Gearldine Bienenstock, PA-C  Note - This record has been created using Dragon software.  Chart creation errors have been sought, but may not always  have been located. Such creation errors do not reflect on  the standard of medical care.

## 2024-01-31 ENCOUNTER — Ambulatory Visit
Admission: RE | Admit: 2024-01-31 | Discharge: 2024-01-31 | Disposition: A | Discharge status: Home / Self Care | Source: Ambulatory Visit | Attending: Internal Medicine | Admitting: Internal Medicine

## 2024-02-02 ENCOUNTER — Telehealth (HOSPITAL_BASED_OUTPATIENT_CLINIC_OR_DEPARTMENT_OTHER): Payer: Self-pay

## 2024-02-02 ENCOUNTER — Encounter: Payer: Self-pay | Admitting: Internal Medicine

## 2024-02-02 NOTE — Addendum Note (Signed)
 Addended byConrad Chase D on: 02/02/2024 12:14 PM   Modules accepted: Orders

## 2024-02-06 NOTE — Progress Notes (Unsigned)
 Office Visit Note  Patient: Heather Collins             Date of Birth: 01-Dec-1960           MRN: [id number]             PCP: Ezell Hollow, MD Referring: Ezell Hollow, MD Visit Date: 02/20/2024 Occupation: @GUAROCC @  Subjective:    History of Present Illness: Heather Collins is a 63 y.o. female with history of osteoarthritis.    [place]   Activities of Daily Living:  Patient reports morning stiffness for *** {minute/hour:19697}.   Patient {ACTIONS;DENIES/REPORTS:21021675::"Denies"} nocturnal pain.  Difficulty dressing/grooming: {ACTIONS;DENIES/REPORTS:21021675::"Denies"} Difficulty climbing stairs: {ACTIONS;DENIES/REPORTS:21021675::"Denies"} Difficulty getting out of chair: {ACTIONS;DENIES/REPORTS:21021675::"Denies"} Difficulty using hands for taps, buttons, cutlery, and/or writing: {ACTIONS;DENIES/REPORTS:21021675::"Denies"}  No Rheumatology ROS completed.   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   Abnormal Pap smear of cervix 11/29/2019   Status post total hysterectomy 11/29/2019   History of stapedectomy 09/11/2019   Otosclerosis involving oval window, nonobliterative, bilateral 09/11/2019   Annual physical exam 05/18/2017   PCP NOTES >>>>>>>>>>>>>> 05/18/2017   Dyslipidemia 04/11/2012   CARPAL TUNNEL SYNDROME 01/01/2011   RESTLESS LEG SYNDROME 11/25/2007   Migraine headache 11/25/2007   G E R D 05/31/2007    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    Otosclerosis involving oval window, nonobliterative    Bilateral   Restless legs syndrome (RLS)     Family History  Problem Relation Age of Onset   Heart disease Mother 44       at  age 36   Arthritis Mother    Lung cancer Father    Diabetes Sister    Healthy Brother    Healthy Brother    Coronary artery disease Paternal Uncle    Healthy Son    Colon cancer Neg Hx    Breast cancer Neg Hx    Colon polyps Neg Hx    Esophageal cancer Neg Hx    Stomach cancer Neg Hx    Rectal cancer Neg Hx    Past Surgical History:  Procedure Laterality Date   COLONOSCOPY     LIPOMA EXCISION  12/05/2020   x2   ROBOTIC ASSISTED LAPAROSCOPIC HYSTERECTOMY AND SALPINGECTOMY Bilateral 11/29/2019   Procedure: XI ROBOTIC ASSISTED LAPAROSCOPIC HYSTERECTOMY AND SALPINGECTOMY;  Surgeon: Ivery Marking, MD;  Location: [facility];  Service: Gynecology;  Laterality: Bilateral;   STAPEDECTOMY Left 2016   Dr. Anell Keep, with a 4.0 mm titanium Verdia Glad prosthesis with a large well and narrow shaft over a vein graft   STAPEDECTOMY Right 2008   UPPER GASTROINTESTINAL ENDOSCOPY     Social History   Social History Narrative   Son, 1997, going to college to graduate 10-2021   Lives with husband   Immunization History  Administered Date(s) Administered   DTaP 04/04/2013   Influenza Split 08/09/2023   Influenza Whole 09/11/2012   Influenza-Unspecified 08/08/2016, 08/03/2017, 08/07/2019, 08/18/2021   PFIZER(Purple Top)SARS-COV-2 Vaccination 12/26/2019, 01/18/2020, 09/05/2020   Td 11/08/2013   Zoster Recombinant(Shingrix) 07/20/2021, 09/22/2021     Objective: Vital Signs: LMP 12/04/2017 (Approximate)    Physical  Exam Vitals and nursing note reviewed.  Constitutional:      Appearance: She is well-developed.  HENT:     Head: Normocephalic and atraumatic.  Eyes:     Conjunctiva/sclera: Conjunctivae normal.  Cardiovascular:     Rate and Rhythm: Normal rate and regular rhythm.     Heart sounds: Normal heart sounds.  Pulmonary:     Effort: Pulmonary effort is normal.     Breath sounds: Normal breath sounds.  Abdominal:     General: Bowel sounds are normal.      Palpations: Abdomen is soft.  Musculoskeletal:     Cervical back: Normal range of motion.  Lymphadenopathy:     Cervical: No cervical adenopathy.  Skin:    General: Skin is warm and dry.     Capillary Refill: Capillary refill takes less than 2 seconds.  Neurological:     Mental Status: She is alert and oriented to person, place, and time.  Psychiatric:        Behavior: Behavior normal.      Musculoskeletal Exam: ***  CDAI Exam: CDAI Score: -- Patient Global: --; Provider Global: -- Swollen: --; Tender: -- Joint Exam 02/20/2024   No joint exam has been documented for this visit   There is currently no information documented on the homunculus. Go to the Rheumatology activity and complete the homunculus joint exam.  Investigation: No additional findings.  Imaging: US  Abdomen Complete Result Date: 02/01/2024 CLINICAL DATA:  Incidental cysts noted on CT EXAM: ABDOMEN ULTRASOUND COMPLETE COMPARISON:  Chest December 15, 2023, ultrasound of abdomen dated April 14, 2012 FINDINGS: Gallbladder: No gallstones or wall thickening visualized. No sonographic Murphy sign noted by sonographer. Common bile duct: Diameter: 3 mm. Liver: 2.3 x 2.6 x 1.7 cm simple cyst identified in the right lobe liver correlating to the largest CT finding. There is a 1.3 cm hyperechoic lesion in the periphery right lobe liver correlating to 1 of the CT findings. This may be a hemangioma. There is a 1.7 x 1.7 cm simple cyst in the peripheral right lobe liver correlating to the CT finding noted. Within normal limits in parenchymal echogenicity. Portal vein is patent on color Doppler imaging with normal direction of blood flow towards the liver. IVC: No abnormality visualized. Pancreas: Visualized portion unremarkable. Spleen: Size and appearance within normal limits. Right Kidney: Length: 9.9 cm. Echogenicity within normal limits. No mass or hydronephrosis visualized. Left Kidney: Length: 9.3 cm. Echogenicity within normal  limits. No mass or hydronephrosis visualized. Abdominal aorta: No aneurysm visualized. Other findings: None. IMPRESSION: 1. No acute abnormality identified. 2. 1.3 cm hyperechoic lesion in the periphery right lobe liver correlating to 1 of the CT findings. This may be a hemangioma. Recommend follow-up ultrasound in 6 months to assess for stability. 3. Simple cysts in the right lobe liver correlating to the CT findings. No follow-up is recommended for these. Electronically Signed   By: Anna Barnes M.D.   On: 02/01/2024 12:08    Recent Labs: Lab Results  Component Value Date   WBC 4.1 08/22/2023   HGB 13.5 08/22/2023   PLT 253.0 08/22/2023   NA 140 08/22/2023   K 4.1 08/22/2023   CL 104 08/22/2023   CO2 29 08/22/2023   GLUCOSE 84 08/22/2023   BUN 19 08/22/2023   CREATININE 0.79 08/22/2023   BILITOT 0.5 01/20/2024   ALKPHOS 72 07/28/2022   AST 24 01/20/2024   ALT 23 01/20/2024   PROT 6.5 01/20/2024   ALBUMIN 4.0 07/28/2022  CALCIUM 9.6 08/22/2023   GFRAA >60 11/30/2019    Speciality Comments: No specialty comments available.  Procedures:  No procedures performed Allergies: Sulfa antibiotics   Assessment / Plan:     Visit Diagnoses: Primary osteoarthritis of both hands  Primary osteoarthritis of both feet  Dyslipidemia  History of gastroesophageal reflux (GERD)  RLS (restless legs syndrome)  Hx of migraines  Orders: No orders of the defined types were placed in this encounter.  No orders of the defined types were placed in this encounter.   Face-to-face time spent with patient was *** minutes. Greater than 50% of time was spent in counseling and coordination of care.  Follow-Up Instructions: No follow-ups on file.   Romayne Clubs, PA-C  Note - This record has been created using Dragon software.  Chart creation errors have been sought, but may not always  have been located. Such creation errors do not reflect on  the standard of medical care.

## 2024-02-07 ENCOUNTER — Telehealth: Payer: Self-pay

## 2024-02-07 NOTE — Telephone Encounter (Signed)
Location changed 

## 2024-02-07 NOTE — Telephone Encounter (Signed)
 Copied from CRM [id number]. Topic: Clinical - Request for Lab/Test Order >> Feb 07, 2024  8:14 AM Elizebeth Brooking wrote: Reason for CRM: Patient called in wanted Dr.Paz to cancel the current order for xrays and send it to [facility] on [facility] that is scheduled for August

## 2024-02-07 NOTE — Addendum Note (Signed)
 Addended by: Conrad Blue Bell D on: 02/07/2024 08:32 AM   Modules accepted: Orders

## 2024-02-08 ENCOUNTER — Ambulatory Visit: Admitting: Physician Assistant

## 2024-02-08 DIAGNOSIS — M19041 Primary osteoarthritis, right hand: Secondary | ICD-10-CM

## 2024-02-08 DIAGNOSIS — G2581 Restless legs syndrome: Secondary | ICD-10-CM

## 2024-02-08 DIAGNOSIS — M19071 Primary osteoarthritis, right ankle and foot: Secondary | ICD-10-CM

## 2024-02-08 DIAGNOSIS — E785 Hyperlipidemia, unspecified: Secondary | ICD-10-CM

## 2024-02-08 DIAGNOSIS — Z8669 Personal history of other diseases of the nervous system and sense organs: Secondary | ICD-10-CM

## 2024-02-08 DIAGNOSIS — Z8719 Personal history of other diseases of the digestive system: Secondary | ICD-10-CM

## 2024-02-20 ENCOUNTER — Encounter: Payer: Self-pay | Admitting: Physician Assistant

## 2024-02-20 ENCOUNTER — Ambulatory Visit: Attending: Physician Assistant | Admitting: Physician Assistant

## 2024-02-20 VITALS — BP 106/71 | HR 92 | Resp 13 | Ht 62.5 in | Wt 121.0 lb

## 2024-02-20 DIAGNOSIS — E785 Hyperlipidemia, unspecified: Secondary | ICD-10-CM | POA: Diagnosis not present

## 2024-02-20 DIAGNOSIS — Z8719 Personal history of other diseases of the digestive system: Secondary | ICD-10-CM

## 2024-02-20 DIAGNOSIS — M19072 Primary osteoarthritis, left ankle and foot: Secondary | ICD-10-CM

## 2024-02-20 DIAGNOSIS — Z8669 Personal history of other diseases of the nervous system and sense organs: Secondary | ICD-10-CM

## 2024-02-20 DIAGNOSIS — M19071 Primary osteoarthritis, right ankle and foot: Secondary | ICD-10-CM

## 2024-02-20 DIAGNOSIS — M19042 Primary osteoarthritis, left hand: Secondary | ICD-10-CM

## 2024-02-20 DIAGNOSIS — M19041 Primary osteoarthritis, right hand: Secondary | ICD-10-CM | POA: Diagnosis not present

## 2024-02-20 DIAGNOSIS — G2581 Restless legs syndrome: Secondary | ICD-10-CM

## 2024-02-24 ENCOUNTER — Ambulatory Visit: Payer: Managed Care, Other (non HMO) | Admitting: Rheumatology

## 2024-03-09 ENCOUNTER — Ambulatory Visit (INDEPENDENT_AMBULATORY_CARE_PROVIDER_SITE_OTHER): Admitting: Audiology

## 2024-03-09 DIAGNOSIS — H90A32 Mixed conductive and sensorineural hearing loss, unilateral, left ear with restricted hearing on the contralateral side: Secondary | ICD-10-CM

## 2024-03-09 DIAGNOSIS — H90A21 Sensorineural hearing loss, unilateral, right ear, with restricted hearing on the contralateral side: Secondary | ICD-10-CM

## 2024-03-09 DIAGNOSIS — H9041 Sensorineural hearing loss, unilateral, right ear, with unrestricted hearing on the contralateral side: Secondary | ICD-10-CM

## 2024-03-09 DIAGNOSIS — H9072 Mixed conductive and sensorineural hearing loss, unilateral, left ear, with unrestricted hearing on the contralateral side: Secondary | ICD-10-CM

## 2024-03-09 NOTE — Progress Notes (Signed)
  [address], [address] [place], [place] [address] [phone]  Audiological Evaluation    Name: Heather Collins     DOB:   1961-10-14      MRN:   [id number]                                                                                     Service Date: 03/09/2024     Accompanied by: unaccompanied    Patient comes today after Dr. Lydia Sams, ENT sent a referral for a hearing evaluation due to concerns with hearing loss.   Symptoms Yes Details  Hearing loss  [x]  Left side is worse than the right . Previous audiograms completed at the request of Dr. Jodelle Mungo  Tinnitus  []    Ear pain/ infections/pressure  []    Balance problems  []    Noise exposure history  []    Previous ear surgeries  [x]  Reported bilateral stapedectomy in both ears more than 5 years ago by Dr. Jodelle Mungo  Family history of hearing loss  []    Amplification  []    Other  []      Otoscopy: Right ear: Clear external ear canals and notable landmarks visualized on the tympanic membrane. Left ear:  Clear external ear canals and notable landmarks visualized on the tympanic membrane.  Tympanometry: Right ear: Type Ad- Normal external ear canal volume with normal middle ear pressure and high tympanic membrane compliance. Left ear: Type Ad- Normal external ear canal volume with normal middle ear pressure and high tympanic membrane compliance.    Pure tone Audiometry: Right ear- Normal hearing from [phone] Hz, then mild to moderately severe sensorineural hearing loss from 2000 Hz - 8000 Hz. Left ear-  Borderline normal hearing at 125Hz , then mild to severe mixed hearing loss from 250 Hz - 8000 Hz.  Speech Audiometry: Right ear- Speech Reception Threshold (SRT) was obtained at 25 dBHL. Left ear-Speech Reception Threshold (SRT) was obtained at 45 dBHL.   Word Recognition Score Tested using NU-6 (MLV) Right ear: 100% was obtained at a presentation level of 65 dBHL with contralateral masking which is deemed as  excellent. Left ear:  92% was obtained at a presentation level of 85 dBHL with contralateral masking which is deemed as  excellent.   The hearing test results were completed under headphones (used inserts for masked BC thresholds) and results are deemed to be of good reliability. Test technique:  conventional      Recommendations: Follow up with ENT as scheduled for today. Return for a hearing evaluation if concerns with hearing changes arise or per MD recommendation. Consider a communication needs assessment after medical clearance for hearing aids is obtained.   Lynnell Fiumara MARIE LEROUX-MARTINEZ, AUD

## 2024-03-12 ENCOUNTER — Ambulatory Visit (INDEPENDENT_AMBULATORY_CARE_PROVIDER_SITE_OTHER): Admitting: Audiology

## 2024-04-19 ENCOUNTER — Ambulatory Visit
Admission: RE | Admit: 2024-04-19 | Discharge: 2024-04-19 | Disposition: A | Discharge status: Home / Self Care | Source: Ambulatory Visit | Attending: Internal Medicine | Admitting: Internal Medicine

## 2024-04-19 VITALS — BP 98/62 | HR 61 | Temp 97.8°F | Resp 16

## 2024-04-19 DIAGNOSIS — M26629 Arthralgia of temporomandibular joint, unspecified side: Secondary | ICD-10-CM

## 2024-04-19 DIAGNOSIS — M26622 Arthralgia of left temporomandibular joint: Secondary | ICD-10-CM | POA: Diagnosis not present

## 2024-04-19 MED ORDER — BACLOFEN 5 MG PO TABS: 5.0000 mg | ORAL_TABLET | Freq: Three times a day (TID) | ORAL | 0 refills | Status: DC | PRN

## 2024-04-19 NOTE — Discharge Instructions (Signed)
 Take baclofen 5mg  every 8 hours as needed for pain to the left jaw/ear. Baclofen can make you sleepy so mostly take this at bedtime. You may take 1/2 pill at first, then if this doesn't help, take 1 whole pill.  Avoid eating chewy foods as this will make TMJ worse. Wear tooth guard at night to help with teeth grinding.   If you develop any new or worsening symptoms or if your symptoms do not start to improve, please return here or follow-up with your primary care provider. If your symptoms are severe, please go to the emergency room.

## 2024-04-19 NOTE — ED Provider Notes (Signed)
 [facility]    CSN: [id number] Arrival date & time: 04/19/24  1315      History   Chief Complaint Chief Complaint  Patient presents with   Otalgia    HPI Heather Collins is a 63 y.o. female.   Heather Collins is a 63 y.o. female presenting for chief complaint of left ear/jaw pain that started 2 days ago. Also reports minimal sore throat last night. She is supposed to wear a bite guard at night to protect her mouth/jaw from grinding her teeth but has not worn this the last few nights an wonders if this triggered her TMJ. Denies recent fever, chills nausea, vomiting, dizziness, ear drainage, headaches, trauma/injury to the ears.  Denies pain to the right ear, recent intake of chewy foods, and nasal congestion.  She has not attempted use of any over-the-counter medications to help with symptoms PTA.   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   Abnormal Pap smear of cervix 11/29/2019   Status post total hysterectomy 11/29/2019   History of stapedectomy 09/11/2019   Otosclerosis involving oval window, nonobliterative, bilateral 09/11/2019   Annual physical exam 05/18/2017   PCP NOTES >>>>>>>>>>>>>> 05/18/2017   Dyslipidemia 04/11/2012   CARPAL TUNNEL SYNDROME 01/01/2011   RESTLESS LEG SYNDROME 11/25/2007   Migraine headache 11/25/2007   G E R D 05/31/2007    Past Surgical History:  Procedure Laterality Date   COLONOSCOPY     LIPOMA EXCISION  12/05/2020   x2   ROBOTIC ASSISTED LAPAROSCOPIC  HYSTERECTOMY AND SALPINGECTOMY Bilateral 11/29/2019   Procedure: XI ROBOTIC ASSISTED LAPAROSCOPIC HYSTERECTOMY AND SALPINGECTOMY;  Surgeon: Ivery Marking, MD;  Location: [facility];  Service: Gynecology;  Laterality: Bilateral;   STAPEDECTOMY Left 2016   Dr. Anell Keep, with a 4.0 mm titanium Verdia Glad prosthesis with a large well and narrow shaft over a vein graft   STAPEDECTOMY Right 2008   UPPER GASTROINTESTINAL ENDOSCOPY      OB History   No obstetric history on file.      Home Medications    Prior to Admission medications   Medication Sig Start Date End Date Taking? Authorizing Provider  Baclofen 5 MG TABS Take 1 tablet (5 mg total) by mouth every 8 (eight) hours as needed (mostly take at bedtime as needed for TMJ.). 04/19/24  Yes Starlene Eaton, FNP  b complex vitamins capsule Take 1 capsule by mouth daily. Patient not taking: Reported on 02/20/2024    [provider]  cetirizine (ZYRTEC) 10 MG tablet Take 10 mg by mouth daily.    [provider]  Glucosamine HCl (GLUCOSAMINE PO) Take by mouth.    [provider]  omega-3 acid ethyl esters (LOVAZA ) 1 g capsule TAKE 1 CAPSULE TWICE A DAY 11/23/23   Romayne Clubs, PA-C  Omeprazole Magnesium (PRILOSEC PO) Take by mouth.    [provider]  sodium fluoride-calcium carbonate (FLORICAL) 8.3-364 MG CAPS capsule  [provider]  VITAMIN D  PO Take 2,000 Units by mouth daily.    [provider]    Family History Family History  Problem Relation Age of Onset   Heart disease Mother 63       at age 27   Arthritis Mother    Lung cancer Father    Diabetes Sister    Healthy Brother    Healthy Brother    Coronary artery disease Paternal Uncle    Healthy Son    Colon cancer Neg Hx    Breast cancer Neg Hx    Colon polyps Neg Hx    Esophageal cancer Neg Hx    Stomach cancer Neg Hx    Rectal cancer Neg Hx     Social History Social History   Tobacco  Use   Smoking status: Former    Current packs/day: 0.00    Average packs/day: 0.3 packs/day for 4.0 years (1.0 ttl pk-yrs)    Types: Cigarettes    Start date: 11/27/1989    Quit date: 11/27/1993    Years since quitting: 30.4    Passive exposure: Never   Smokeless tobacco: Never   Tobacco comments:    quit in the 90s  Vaping Use   Vaping status: Never Used  Substance Use Topics   Alcohol use: No   Drug use: Never     Allergies   Sulfa antibiotics   Review of Systems Review of Systems  HENT:  Positive for ear pain.   Per HPI   Physical Exam Triage Vital Signs ED Triage Vitals  Encounter Vitals Group     BP 04/19/24 1322 98/62     Girls Systolic BP Percentile --      Girls Diastolic BP Percentile --      Boys Systolic BP Percentile --      Boys Diastolic BP Percentile --      Pulse Rate 04/19/24 1322 61     Resp 04/19/24 1322 16     Temp 04/19/24 1322 97.8 F (36.6 C)     Temp Source 04/19/24 1319 Oral     SpO2 04/19/24 1322 98 %     Weight --      Height --      Head Circumference --      Peak Flow --      Pain Score 04/19/24 1321 7     Pain Loc --      Pain Education --      Exclude from Growth Chart --    No data found.  Updated Vital Signs BP 98/62 (BP Location: Right Arm)   Pulse 61   Temp 97.8 F (36.6 C) (Oral)   Resp 16   LMP 12/04/2017 (Approximate)   SpO2 98%   Visual Acuity Right Eye Distance:   Left Eye Distance:   Bilateral Distance:    Right Eye Near:   Left Eye Near:    Bilateral Near:     Physical Exam Vitals and nursing note reviewed.  Constitutional:      Appearance: She is not ill-appearing or toxic-appearing.  HENT:     Head: Normocephalic and atraumatic.     Jaw: Tenderness, pain on movement and malocclusion (Palpable click present to left jaw with active ROM.) present. No trismus.     Right Ear: Hearing, tympanic membrane, ear canal and external ear normal.     Left Ear: Hearing, tympanic membrane, ear canal and  external ear normal.     Nose:  Nose normal.     Mouth/Throat:     Lips: Pink.     Mouth: Mucous membranes are moist. No injury or oral lesions.     Dentition: Normal dentition.     Tongue: No lesions.     Pharynx: Oropharynx is clear. Uvula midline. No pharyngeal swelling, oropharyngeal exudate, posterior oropharyngeal erythema, uvula swelling or postnasal drip.     Tonsils: No tonsillar exudate.   Eyes:     General: Lids are normal. Vision grossly intact. Gaze aligned appropriately.     Extraocular Movements: Extraocular movements intact.     Conjunctiva/sclera: Conjunctivae normal.   Neck:     Trachea: Trachea and phonation normal.  Pulmonary:     Effort: Pulmonary effort is normal.   Musculoskeletal:     Cervical back: Neck supple.  Lymphadenopathy:     Cervical: No cervical adenopathy.   Skin:    General: Skin is warm and dry.     Capillary Refill: Capillary refill takes less than 2 seconds.     Findings: No rash.   Neurological:     General: No focal deficit present.     Mental Status: She is alert and oriented to person, place, and time. Mental status is at baseline.     Cranial Nerves: No dysarthria or facial asymmetry.   Psychiatric:        Mood and Affect: Mood normal.        Speech: Speech normal.        Behavior: Behavior normal.        Thought Content: Thought content normal.        Judgment: Judgment normal.      UC Treatments / Results  Labs (all labs ordered are listed, but only abnormal results are displayed) Labs Reviewed - No data to display  EKG   Radiology No results found.  Procedures Procedures (including critical care time)  Medications Ordered in UC Medications - No data to display  Initial Impression / Assessment and Plan / UC Course  I have reviewed the triage vital signs and the nursing notes.  Pertinent labs & imaging results that were available during my care of the patient were reviewed by me and considered in my medical  decision making (see chart for details).   1. TMJ dysfunction Left ear/jaw pain is due to TMJ dysfunction likely secondary to teeth grinding. No signs of AOM,AOE, no red flags on exam. Recommend treatment with tylenol , heat, avoiding chewy foods, use of bite guards, and PRN use of muscle relaxer. Offered flexeril, however she takes care of her elderly mother and wishes to have a less sedating muscle relaxer, therefore baclofen ordered. Discussed drowsiness precautions.  May follow-up with PCP if symptoms fail to improve.  Counseled patient on potential for adverse effects with medications prescribed/recommended today, strict ER and return-to-clinic precautions discussed, patient verbalized understanding.    Final Clinical Impressions(s) / UC Diagnoses   Final diagnoses:  TMJ pain dysfunction syndrome     Discharge Instructions      Take baclofen 5mg  every 8 hours as needed for pain to the left jaw/ear. Baclofen can make you sleepy so mostly take this at bedtime. You may take 1/2 pill at first, then if this doesn't help, take 1 whole pill.  Avoid eating chewy foods as this will make TMJ worse. Wear tooth guard at night to help with teeth grinding.   If you develop any new or worsening symptoms or if your symptoms do not start to  improve, please return here or follow-up with your primary care provider. If your symptoms are severe, please go to the emergency room.     ED Prescriptions     Medication Sig Dispense Auth. Provider   Baclofen 5 MG TABS Take 1 tablet (5 mg total) by mouth every 8 (eight) hours as needed (mostly take at bedtime as needed for TMJ.). 20 tablet Starlene Eaton, FNP      PDMP not reviewed this encounter.   Starlene Eaton, [place] 04/19/24 2147

## 2024-04-19 NOTE — ED Triage Notes (Signed)
 Pt c/o left ear pain and jaw pain for 2 days.  She did have a little sore throat last night.

## 2024-06-29 ENCOUNTER — Telehealth: Payer: Self-pay | Admitting: Adult Health

## 2024-06-29 ENCOUNTER — Ambulatory Visit: Admitting: Internal Medicine

## 2024-06-29 ENCOUNTER — Encounter: Payer: Self-pay | Admitting: Internal Medicine

## 2024-06-29 VITALS — BP 98/72 | HR 60 | Temp 97.9°F | Resp 16 | Ht 62.5 in | Wt 122.0 lb

## 2024-06-29 DIAGNOSIS — R5383 Other fatigue: Secondary | ICD-10-CM

## 2024-06-29 DIAGNOSIS — K7689 Other specified diseases of liver: Secondary | ICD-10-CM

## 2024-06-29 DIAGNOSIS — I959 Hypotension, unspecified: Secondary | ICD-10-CM

## 2024-06-29 DIAGNOSIS — R42 Dizziness and giddiness: Secondary | ICD-10-CM | POA: Diagnosis not present

## 2024-06-29 LAB — CBC WITH DIFFERENTIAL/PLATELET
Basophils Absolute: 0 K/uL (ref 0.0–0.1)
Basophils Relative: 0.5 % (ref 0.0–3.0)
Eosinophils Absolute: 0.1 K/uL (ref 0.0–0.7)
Eosinophils Relative: 1.2 % (ref 0.0–5.0)
HCT: 40.4 % (ref 36.0–46.0)
Hemoglobin: 13.5 g/dL (ref 12.0–15.0)
Lymphocytes Relative: 30.7 % (ref 12.0–46.0)
Lymphs Abs: 1.5 K/uL (ref 0.7–4.0)
MCHC: 33.5 g/dL (ref 30.0–36.0)
MCV: 92.7 fl (ref 78.0–100.0)
Monocytes Absolute: 0.4 K/uL (ref 0.1–1.0)
Monocytes Relative: 7.7 % (ref 3.0–12.0)
Neutro Abs: 3 K/uL (ref 1.4–7.7)
Neutrophils Relative %: 59.9 % (ref 43.0–77.0)
Platelets: 232 K/uL (ref 150.0–400.0)
RBC: 4.35 Mil/uL (ref 3.87–5.11)
RDW: 13.2 % (ref 11.5–15.5)
WBC: 4.9 K/uL (ref 4.0–10.5)

## 2024-06-29 LAB — COMPREHENSIVE METABOLIC PANEL WITH GFR
ALT: 17 U/L (ref 0–35)
AST: 20 U/L (ref 0–37)
Albumin: 4.4 g/dL (ref 3.5–5.2)
Alkaline Phosphatase: 70 U/L (ref 39–117)
BUN: 14 mg/dL (ref 6–23)
CO2: 29 meq/L (ref 19–32)
Calcium: 9.1 mg/dL (ref 8.4–10.5)
Chloride: 103 meq/L (ref 96–112)
Creatinine, Ser: 0.71 mg/dL (ref 0.40–1.20)
GFR: 91.13 mL/min (ref >60.00)
Glucose, Bld: 90 mg/dL (ref 70–99)
Potassium: 4.1 meq/L (ref 3.5–5.1)
Sodium: 140 meq/L (ref 135–145)
Total Bilirubin: 0.7 mg/dL (ref 0.2–1.2)
Total Protein: 6.6 g/dL (ref 6.0–8.3)

## 2024-06-29 LAB — B12 AND FOLATE PANEL
Folate: 17.2 ng/mL (ref >5.9)
Vitamin B-12: 435 pg/mL (ref 211–911)

## 2024-06-29 LAB — HEMOGLOBIN A1C: Hgb A1c MFr Bld: 6.1 % (ref 4.6–6.5)

## 2024-06-29 LAB — TSH: TSH: 1.85 u[IU]/mL (ref 0.35–5.50)

## 2024-06-29 LAB — VITAMIN D 25 HYDROXY (VIT D DEFICIENCY, FRACTURES): VITD: 45 ng/mL (ref 30.00–100.00)

## 2024-06-29 NOTE — Patient Instructions (Addendum)
 Drink plenty of water  It is okay to put some salt in your food.  If he has severe dizziness or worsening fatigue let us  know  You are due for a ultrasound of your liver next month  You are due for a CT of your chest this month, your can reach pulmonary to arrange.  Check the  blood pressure regularly Blood pressure goal:  between 110/65 and  135/85. If it is consistently higher or lower, let me know     GO TO THE LAB :  Get the blood work   Your results will be posted on MyChart with my comments  Go to the front desk for the checkout Please make an appointment for a checkup in 6 weeks.

## 2024-06-29 NOTE — Telephone Encounter (Signed)
 I spoke with the patient, and they're unclear about whether the CT needs to be completed this month or in February 2026. The order indicates it is due on 02/26, but the Office visit would prefer the CT to be completed this month. Does the patient need to be scheduled for the next available for the a CT scan?

## 2024-06-29 NOTE — Progress Notes (Signed)
 Subjective:    Patient ID: Heather Collins, female    DOB: 17-Oct-1961, 63 y.o.   MRN: [id number]  DOS:  06/29/2024 Type of visit - description: Acute  Symptoms started 2 months ago: Feeling fatigued, tired, exhausted. She is able to ride hers bike and feels okay with no chest pain, DOE, palpitations. Denies nausea, vomiting, diarrhea.  No blood in the stools. No cough. Question of night sweats, unsure if is sweats or simply hot flashes. No major variation on her weight.  Also, occasionally feels lightheaded when she gets up, symptoms are short-lived. Denies any diplopia, slurred speech or motor deficits.  Her mother moved in with her 09-2023, she has become the caregiver.   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 Colby, with a 4.0 mm titanium Lang prosthesis with a large well and narrow shaft over a vein graft   STAPEDECTOMY Right 2008   UPPER GASTROINTESTINAL ENDOSCOPY      Current Outpatient Medications   Medication Instructions   b complex vitamins capsule 1 capsule, Daily   Baclofen  5 mg, Oral, Every 8 hours PRN   cetirizine (ZYRTEC) 10 mg, Daily   Glucosamine HCl (GLUCOSAMINE PO) Take by mouth.   omega-3 acid ethyl esters (LOVAZA ) 1 g, Oral, 2 times daily   Omeprazole Magnesium (PRILOSEC PO) Take by mouth.   sodium fluoride-calcium carbonate (FLORICAL) 8.3-364 MG CAPS capsule    VITAMIN D  PO 2,000 Units, Daily       Objective:   Physical Exam BP 98/72   Pulse 60   Temp 97.9 F (36.6 C) (Oral)   Resp 16   Ht 5' 2.5 (1.588 m)   Wt 122 lb (55.3 kg)   LMP 12/04/2017 (Approximate)   SpO2 98%   BMI 21.96 kg/m  General: Well developed, NAD, BMI noted Neck: No  thyromegaly  HEENT:  Normocephalic . Face symmetric, atraumatic Lungs:  CTA B Normal respiratory effort, no intercostal retractions, no accessory muscle use. Heart: RRR,  no murmur.  Abdomen:  Not distended, soft, non-tender. No rebound or rigidity.   Lower extremities: no pretibial edema bilaterally  Skin: Exposed areas without rash. Not pale. Not jaundice Neurologic:  alert & oriented X3.  Speech normal, gait appropriate for age and unassisted Strength symmetric and appropriate for age.  Psych: Cognition and judgment appear intact.  Cooperative with normal attention span and concentration.  Behavior appropriate. No anxious or depressed appearing.     Assessment   Assessment  GERD Dyslipidemia DJD Menopausal, hysterectomy for abnormal Paps 11-2019, cervix pathology acute and chronic cervicitis RLS HOH , on Florical to prevent calcium deposits in the middle ear EGD 02-2015 wnl, BX negative Migraine headaches- resolved after hysterectomy Coronary calcium score 08-2023: 0.   PLAN Fatigue, lightheaded, low blood pressure: Symptoms started 2 months ago, ROS negative for cardiopulmonary symptoms. She had a normal coronary calcium score last year. Orthostatic vital signs negative.  On chart review BP has  been 110, 100.  Low suspicions for the low blood pressure to be the culprit of the symptoms. Will get blood work, see orders. Will call sooner than 6 weeks if needed Recommend to check ambulatory BPs.  If needed she could call for a nurse appointment to check the accuracy of her new monitor.  She is under a lot of stress.  See next Caregiver stress: Her mother moved in with her in November 2024, she is a 24-hour caregiver, frequently has to wake up at night to help her.  Her mother  had a broken hip, has done PT OT at home, has heart failure, some degree of dementia. Right lobe of liver cysts: Incidental finding on CT chest 12-2023, USd abdomen 01/2024 confirmed the lesion, hemangioma?  Recommended repeat US  07/2024. Orders sent Abnormal CT chest: Pulmonary recommended to repeat CT this month DJD: Saw rheumatology 02/20/2024.  Pain was mostly located at the hands, Dx DJD RTC 6 weeks to reassess fatigue and lightheadedness.

## 2024-06-29 NOTE — Telephone Encounter (Signed)
 Should be done August as wanted it in 6 months .

## 2024-06-29 NOTE — Telephone Encounter (Signed)
 Copied from CRM #[id number]. Topic: Appointments - Scheduling Inquiry for Clinic >> Jun 29, 2024 12:56 PM Corean SAUNDERS wrote: Reason for CRM: Please call patient back to schedule her CT per the order from [facility].

## 2024-07-01 NOTE — Assessment & Plan Note (Signed)
 Fatigue, lightheaded, low blood pressure: Symptoms started 2 months ago, ROS negative for cardiopulmonary symptoms. She had a normal coronary calcium score last year. Orthostatic vital signs negative.  On chart review BP has been 110, 100.  Low suspicions for the low blood pressure to be the culprit of the symptoms. Will get blood work, see orders. Will call sooner than 6 weeks if needed Recommend to check ambulatory BPs.  If needed she could call for a nurse appointment to check the accuracy of her new monitor.  She is under a lot of stress.  See next Caregiver stress: Her mother moved in with her in November 2024, she is a 24-hour caregiver, frequently has to wake up at night to help her.  Her mother  had a broken hip, has done PT OT at home, has heart failure, some degree of dementia. Right lobe of liver cysts: Incidental finding on CT chest 12-2023, USd abdomen 01/2024 confirmed the lesion, hemangioma?  Recommended repeat US  07/2024. Orders sent Abnormal CT chest: Pulmonary recommended to repeat CT this month DJD: Saw rheumatology 02/20/2024.  Pain was mostly located at the hands, Dx DJD RTC 6 weeks to reassess fatigue and lightheadedness.

## 2024-07-02 NOTE — Telephone Encounter (Signed)
 Patient has been rescheduled to 07/06/24. NFN

## 2024-07-02 NOTE — Telephone Encounter (Signed)
 CT to be scheduled in August per TP, looks like it was scheduled in February.

## 2024-07-03 ENCOUNTER — Encounter: Payer: Self-pay | Admitting: Internal Medicine

## 2024-07-03 ENCOUNTER — Ambulatory Visit: Payer: Self-pay | Admitting: Internal Medicine

## 2024-07-04 ENCOUNTER — Ambulatory Visit
Admission: RE | Admit: 2024-07-04 | Discharge: 2024-07-04 | Disposition: A | Discharge status: Home / Self Care | Source: Ambulatory Visit | Attending: Internal Medicine | Admitting: Internal Medicine

## 2024-07-04 DIAGNOSIS — K7689 Other specified diseases of liver: Secondary | ICD-10-CM

## 2024-07-05 ENCOUNTER — Ambulatory Visit

## 2024-07-05 NOTE — Progress Notes (Signed)
 BP Readings from Last 3 Encounters:  06/29/24 98/72  04/19/24 98/62  02/20/24 106/71   Patient here for BP check due to hypotension.  Per ov note: Recommend to check ambulatory BPs.  If needed she could call for a nurse appointment to check the accuracy of her new monitor.   BP today is 106/68 With a pulse of 64  Home monitor reads 103/68 with a pulse of 64.  Patient advised per Dr. Domenica continue to monitor at home and let pcp know of any significant changes. Follow up during scheduled appointment in October.

## 2024-07-06 ENCOUNTER — Ambulatory Visit
Admission: RE | Admit: 2024-07-06 | Discharge: 2024-07-06 | Disposition: A | Discharge status: Home / Self Care | Source: Ambulatory Visit | Attending: Adult Health | Admitting: Adult Health

## 2024-07-06 DIAGNOSIS — R918 Other nonspecific abnormal finding of lung field: Secondary | ICD-10-CM

## 2024-07-12 ENCOUNTER — Telehealth: Payer: Self-pay

## 2024-07-12 DIAGNOSIS — R935 Abnormal findings on diagnostic imaging of other abdominal regions, including retroperitoneum: Secondary | ICD-10-CM

## 2024-07-12 DIAGNOSIS — K769 Liver disease, unspecified: Secondary | ICD-10-CM

## 2024-07-12 NOTE — Telephone Encounter (Signed)
 Copied from CRM [id number]. Topic: Clinical - Request for Lab/Test Order >> Jul 12, 2024 10:12 AM Dedra B wrote: Reason for CRM: Pt wanted to let provider know that she would like to go ahead and get the MRI done. She prefers to have it done at [facility] on [facility]. because they have an open MRI machine and she's claustrophobic. Pt said she can do it without contrast but it's whichever her provider prefers. Imaging tactility number is [phone].

## 2024-07-20 ENCOUNTER — Ambulatory Visit: Payer: Self-pay | Admitting: Adult Health

## 2024-07-20 DIAGNOSIS — R918 Other nonspecific abnormal finding of lung field: Secondary | ICD-10-CM

## 2024-07-24 NOTE — Telephone Encounter (Signed)
 Order placed

## 2024-07-24 NOTE — Telephone Encounter (Signed)
 Okay to proceed with a abdominal MRI with liver protocol with or without contrast, DX abnormal ultrasound of the liver. Schedule at the patient's requested facility. Advised patient she needs to call the office 2 weeks after the procedure is done to  be sure the report reach our office.

## 2024-08-13 ENCOUNTER — Ambulatory Visit: Admitting: Internal Medicine

## 2024-09-03 ENCOUNTER — Encounter: Payer: Self-pay | Admitting: Internal Medicine

## 2024-09-03 ENCOUNTER — Ambulatory Visit: Admitting: Internal Medicine

## 2024-09-03 VITALS — BP 112/64 | HR 84 | Temp 97.6°F | Resp 16 | Ht 62.25 in | Wt 122.5 lb

## 2024-09-03 DIAGNOSIS — Z0000 Encounter for general adult medical examination without abnormal findings: Secondary | ICD-10-CM

## 2024-09-03 DIAGNOSIS — Z23 Encounter for immunization: Secondary | ICD-10-CM | POA: Diagnosis not present

## 2024-09-03 DIAGNOSIS — E785 Hyperlipidemia, unspecified: Secondary | ICD-10-CM

## 2024-09-03 LAB — LIPID PANEL
Cholesterol: 211 mg/dL — ABNORMAL HIGH (ref 0–200)
HDL: 71.8 mg/dL (ref >39.00)
LDL Cholesterol: 126 mg/dL — ABNORMAL HIGH (ref 0–99)
NonHDL: 138.99
Total CHOL/HDL Ratio: 3
Triglycerides: 66 mg/dL (ref 0.0–149.0)
VLDL: 13.2 mg/dL (ref 0.0–40.0)

## 2024-09-03 NOTE — Progress Notes (Signed)
 Subjective:    Patient ID: Heather Collins, female    DOB: 25-Jun-1961, 63 y.o.   MRN: [id number]  DOS:  09/03/2024 CPX  Discussed the use of AI scribe software for clinical note transcription with the patient, who gave verbal consent to proceed.  History of Present Illness  General health status - Feels improved since last visit, when she experienced fatigue - Exercises daily - Maintains a healthy diet and avoids sweets  Urinary symptoms - Urinary problems present, without further elaboration  Glycemic status - Hemoglobin A1c of 6.1 in August, consistent with prediabetes  Gastrointestinal symptoms - Takes omeprazole for acid reflux  Hearing loss and ear implants - Bilateral ear implants (referred to as stethectomies) - Was told not to proceed with the MRI I ordered a few weeks ago - Takes Florical for ear condition  Preventive health maintenance - Colonoscopy in 2022, next due in 7 years - Regular gynecologic care - Bone density test performed last year - Tetanus vaccination last administered in 2015, currently due  Cardiopulmonary symptoms - No chest pain - No dyspnea - No peripheral edema    Review of Systems  Other than above, a 14 point review of systems is negative    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 Colby, with a 4.0 mm titanium Lang prosthesis with a large well and narrow shaft over a vein graft   STAPEDECTOMY Right 2008   UPPER GASTROINTESTINAL ENDOSCOPY      Current Outpatient Medications  Medication Instructions   b complex vitamins capsule 1 capsule, Daily   cetirizine (ZYRTEC) 10 mg, Daily   Glucosamine HCl (GLUCOSAMINE PO) Take by mouth.   omega-3 acid ethyl esters (LOVAZA ) 1 g, Oral, 2 times daily   Omeprazole Magnesium (PRILOSEC PO) Take by mouth.   sodium fluoride-calcium carbonate (FLORICAL) 8.3-364 MG CAPS capsule    VITAMIN D  PO 2,000 Units, Daily       Objective:   Physical Exam BP 112/64   Pulse 84   Temp 97.6 F (36.4 C) (Oral)   Resp 16   Ht 5' 2.25 (1.581 m)   Wt 122 lb 8 oz (55.6 kg)   LMP 12/04/2017 (Approximate)   SpO2 98%   BMI 22.23 kg/m  General: Well developed, NAD, BMI noted Neck: No  thyromegaly  HEENT:  Normocephalic . Face symmetric, atraumatic Lungs:  CTA B Normal respiratory effort, no intercostal retractions, no accessory muscle use. Heart: RRR,  no murmur.  Abdomen:  Not distended, soft, non-tender. No rebound or rigidity.   Lower extremities: no pretibial edema bilaterally  Skin: Exposed areas without rash. Not pale. Not jaundice Neurologic:  alert & oriented X3.  Speech  normal, gait appropriate for age and unassisted Strength symmetric and appropriate for age.  Psych: Cognition and judgment appear intact.  Cooperative with normal attention span and concentration.  Behavior appropriate. No anxious or depressed appearing.     Assessment     Assessment  GERD Dyslipidemia DJD Menopausal, hysterectomy for abnormal Paps 11-2019, cervix pathology acute and chronic cervicitis RLS HOH , on Florical to prevent calcium deposits in the middle ear EGD 02-2015  wnl, BX negative Migraine headaches- resolved after hysterectomy Coronary calcium score 08-2023: 0.    Assessment & Plan Here for CPX -Td 2015 -shingrix x 2 - flu shot: Today - Rec rec Tdap, Covid booster pro>cons  -Female care: per gyn,  had a MMG July 2024 - DEXAs at gyn, per pt  -CCS: Colonoscopy 02-2011, cscope 07-01-21, next  7 years per GI. - Labs: Reviewed, will get FLP - Has excellent lifestyle    Other issues addressed Prediabetes A1c at 6.1 indicates prediabetes. Emphasized lifestyle modifications. - Monitor A1c levels at next visit  GERD: Continue omeprazole as prescribed. Abnormal hepatic lesion: Lesion benign but radiology recommended either MRI or ultrasound in 1 year.  Patient had  implants at the inner ears and was told at radiology not pursue MRI unless it can be done at a different facility. At this point we agreed to check ultrasound at the 1 year mark 07/2025. Fatigue, lightheadedness, low blood pressure: See LOV, blood work normal, symptoms resolved. RTC 6 months

## 2024-09-03 NOTE — Patient Instructions (Addendum)
 GO TO THE LAB :  Get the blood work    Then, go to the front desk for the checkout Please make an appointment for a checkup in 6 months     VISIT SUMMARY: Today, you had your annual physical exam. Your blood pressure is well-controlled, and there are no new concerns. We discussed your prediabetes, immunizations, and other ongoing health issues.  YOUR PLAN: GENERAL HEALTH: Routine visit with no new concerns. Blood pressure controlled. Family history negative for colon and breast cancer. -Continue regular exercise and healthy diet.  IMMUNIZATION MANAGEMENT: You are due for a tetanus booster and a flu shot. We also discussed the COVID booster, which you prefer to wait on due to low risk factors. -Administer flu shot today. - Recommend that tetanus shot -Discuss COVID booster at future visits.  PREDIABETES: Your A1c level is 6.1, which indicates prediabetes. We emphasized the importance of lifestyle modifications. -Monitor A1c levels at next visit. -Encourage continued healthy diet and regular exercise.  -Check cholesterol levels today.  GASTROESOPHAGEAL REFLUX DISEASE (GERD): Your GERD is managed with omeprazole and you have no new symptoms. -Continue omeprazole as prescribed.  RIGHT LOBE LIVER CYSTS (BENIGN HEPATIC LESIONS): You have benign liver cysts. MRI is not feasible due to your ear implants, so an ultrasound is recommended in one year. -Schedule ultrasound of liver in one year.  HEARING LOSS WITH EAR IMPLANTS: Your hearing loss is managed with ear implants and there are no new issues. -Continue current management.

## 2024-09-03 NOTE — Assessment & Plan Note (Signed)
 Here for CPX  Other issues addressed Prediabetes A1c at 6.1 indicates prediabetes. Emphasized lifestyle modifications. - Monitor A1c levels at next visit  GERD: Continue omeprazole as prescribed. Abnormal hepatic lesion: Lesion benign but radiology recommended either MRI or ultrasound in 1 year.  Patient had  implants at the inner ears and was told at radiology not pursue MRI unless it can be done at a different facility. At this point we agreed to check ultrasound at the 1 year mark 07/2025. Fatigue, lightheadedness, low blood pressure: See LOV, blood work normal, symptoms resolved. RTC 6 months

## 2024-09-03 NOTE — Assessment & Plan Note (Signed)
 Here for CPX -Td 2015 -shingrix x 2 - flu shot: Today - Rec rec Tdap, Covid booster pro>cons  -Female care: per gyn,  had a MMG July 2024 - DEXAs at gyn, per pt  -CCS: Colonoscopy 02-2011, cscope 07-01-21, next  7 years per GI. - Labs: Reviewed, will get FLP - Has excellent lifestyle

## 2024-09-05 ENCOUNTER — Ambulatory Visit: Payer: Self-pay | Admitting: Internal Medicine

## 2024-12-21 ENCOUNTER — Other Ambulatory Visit

## 2025-02-06 ENCOUNTER — Ambulatory Visit: Admitting: Internal Medicine
